# Patient Record
Sex: Female | Born: 1975 | Race: White | Hispanic: No | Smoking: Current every day smoker
Health system: Southern US, Community
[De-identification: ages and names within clinical notes are randomized; demographics above are authoritative.]

## PROBLEM LIST (undated history)

## (undated) DIAGNOSIS — F329 Major depressive disorder, single episode, unspecified: Secondary | ICD-10-CM

## (undated) DIAGNOSIS — F419 Anxiety disorder, unspecified: Secondary | ICD-10-CM

## (undated) DIAGNOSIS — I10 Essential (primary) hypertension: Secondary | ICD-10-CM

## (undated) DIAGNOSIS — F32A Depression, unspecified: Secondary | ICD-10-CM

## (undated) DIAGNOSIS — E7800 Pure hypercholesterolemia, unspecified: Secondary | ICD-10-CM

## (undated) HISTORY — PX: TONSILLECTOMY: SUR1361

---

## 2008-01-05 ENCOUNTER — Emergency Department: Payer: Self-pay | Admitting: Unknown Physician Specialty

## 2010-11-15 ENCOUNTER — Emergency Department: Payer: Self-pay | Admitting: Emergency Medicine

## 2010-12-21 ENCOUNTER — Emergency Department: Payer: Self-pay | Admitting: Emergency Medicine

## 2011-02-07 ENCOUNTER — Emergency Department: Payer: Self-pay | Admitting: Emergency Medicine

## 2011-05-10 ENCOUNTER — Inpatient Hospital Stay: Payer: Self-pay | Admitting: Psychiatry

## 2011-05-21 ENCOUNTER — Emergency Department: Payer: Self-pay | Admitting: Internal Medicine

## 2011-05-29 ENCOUNTER — Emergency Department: Payer: Self-pay | Admitting: Emergency Medicine

## 2011-06-22 ENCOUNTER — Ambulatory Visit: Payer: Self-pay | Admitting: Physician Assistant

## 2011-11-24 ENCOUNTER — Emergency Department: Payer: Self-pay | Admitting: Emergency Medicine

## 2011-12-20 ENCOUNTER — Inpatient Hospital Stay: Payer: Self-pay | Admitting: Psychiatry

## 2011-12-20 LAB — URINALYSIS, COMPLETE
Leukocyte Esterase: NEGATIVE
Nitrite: NEGATIVE
Ph: 5 (ref 4.5–8.0)
Protein: 30
RBC,UR: 23 /HPF (ref 0–5)
Specific Gravity: 1.025 (ref 1.003–1.030)

## 2011-12-20 LAB — COMPREHENSIVE METABOLIC PANEL
Albumin: 3.8 g/dL (ref 3.4–5.0)
Alkaline Phosphatase: 91 U/L (ref 50–136)
Bilirubin,Total: 0.2 mg/dL (ref 0.2–1.0)
Calcium, Total: 9.1 mg/dL (ref 8.5–10.1)
Chloride: 104 mmol/L (ref 98–107)
Co2: 24 mmol/L (ref 21–32)
Creatinine: 0.68 mg/dL (ref 0.60–1.30)
EGFR (Non-African Amer.): >60
Potassium: 3.5 mmol/L (ref 3.5–5.1)
SGOT(AST): 23 U/L (ref 15–37)
SGPT (ALT): 28 U/L
Total Protein: 7.5 g/dL (ref 6.4–8.2)

## 2011-12-20 LAB — DRUG SCREEN, URINE
Barbiturates, Ur Screen: NEGATIVE (ref ?–200)
Benzodiazepine, Ur Scrn: NEGATIVE (ref ?–200)
Cannabinoid 50 Ng, Ur ~~LOC~~: NEGATIVE (ref ?–50)
Cocaine Metabolite,Ur ~~LOC~~: NEGATIVE (ref ?–300)
MDMA (Ecstasy)Ur Screen: NEGATIVE (ref ?–500)
Opiate, Ur Screen: NEGATIVE (ref ?–300)
Tricyclic, Ur Screen: NEGATIVE (ref ?–1000)

## 2011-12-20 LAB — SALICYLATE LEVEL: Salicylates, Serum: 4.2 mg/dL — ABNORMAL HIGH

## 2011-12-20 LAB — CBC
HCT: 39.7 % (ref 35.0–47.0)
MCV: 85 fL (ref 80–100)
RBC: 4.69 10*6/uL (ref 3.80–5.20)
RDW: 13.6 % (ref 11.5–14.5)
WBC: 12.4 10*3/uL — ABNORMAL HIGH (ref 3.6–11.0)

## 2011-12-20 LAB — PREGNANCY, URINE: Pregnancy Test, Urine: NEGATIVE m[IU]/mL

## 2011-12-20 LAB — ACETAMINOPHEN LEVEL: Acetaminophen: <2 ug/mL

## 2011-12-27 LAB — FOLATE: Folic Acid: 6.6 ng/mL (ref 3.1–100.0)

## 2011-12-27 LAB — LIPID PANEL
Cholesterol: 222 mg/dL — ABNORMAL HIGH (ref 0–200)
Ldl Cholesterol, Calc: 112 mg/dL — ABNORMAL HIGH (ref 0–100)
Triglycerides: 383 mg/dL — ABNORMAL HIGH (ref 0–200)
VLDL Cholesterol, Calc: 77 mg/dL — ABNORMAL HIGH (ref 5–40)

## 2012-01-01 ENCOUNTER — Emergency Department: Payer: Self-pay | Admitting: Emergency Medicine

## 2012-08-21 LAB — URINALYSIS, COMPLETE
Bacteria: NONE SEEN
Ketone: NEGATIVE
Protein: NEGATIVE
RBC,UR: 7 /HPF (ref 0–5)
Squamous Epithelial: 10

## 2012-08-21 LAB — DRUG SCREEN, URINE
Amphetamines, Ur Screen: NEGATIVE (ref ?–1000)
Barbiturates, Ur Screen: NEGATIVE (ref ?–200)
Cocaine Metabolite,Ur ~~LOC~~: NEGATIVE (ref ?–300)
MDMA (Ecstasy)Ur Screen: NEGATIVE (ref ?–500)
Methadone, Ur Screen: NEGATIVE (ref ?–300)
Opiate, Ur Screen: NEGATIVE (ref ?–300)
Phencyclidine (PCP) Ur S: NEGATIVE (ref ?–25)
Tricyclic, Ur Screen: NEGATIVE (ref ?–1000)

## 2012-08-21 LAB — CBC
HCT: 33.4 % — ABNORMAL LOW (ref 35.0–47.0)
MCH: 27.5 pg (ref 26.0–34.0)
MCHC: 32.6 g/dL (ref 32.0–36.0)
Platelet: 582 10*3/uL — ABNORMAL HIGH (ref 150–440)
RDW: 15.6 % — ABNORMAL HIGH (ref 11.5–14.5)
WBC: 11.1 10*3/uL — ABNORMAL HIGH (ref 3.6–11.0)

## 2012-08-21 LAB — COMPREHENSIVE METABOLIC PANEL
Calcium, Total: 8.5 mg/dL (ref 8.5–10.1)
Chloride: 109 mmol/L — ABNORMAL HIGH (ref 98–107)
Co2: 22 mmol/L (ref 21–32)
Creatinine: 0.47 mg/dL — ABNORMAL LOW (ref 0.60–1.30)
EGFR (African American): >60
Glucose: 86 mg/dL (ref 65–99)
Potassium: 3.3 mmol/L — ABNORMAL LOW (ref 3.5–5.1)
SGPT (ALT): 16 U/L (ref 12–78)
Sodium: 139 mmol/L (ref 136–145)

## 2012-08-21 LAB — ETHANOL: Ethanol %: <0.003 % (ref 0.000–0.080)

## 2012-08-21 LAB — PREGNANCY, URINE: Pregnancy Test, Urine: NEGATIVE m[IU]/mL

## 2012-08-21 LAB — SALICYLATE LEVEL: Salicylates, Serum: 21.4 mg/dL — ABNORMAL HIGH

## 2012-08-22 ENCOUNTER — Inpatient Hospital Stay: Payer: Self-pay | Admitting: Psychiatry

## 2012-08-23 LAB — BEHAVIORAL MEDICINE 1 PANEL
Albumin: 3.1 g/dL — ABNORMAL LOW (ref 3.4–5.0)
Alkaline Phosphatase: 71 U/L (ref 50–136)
Anion Gap: 6 — ABNORMAL LOW (ref 7–16)
BUN: 14 mg/dL (ref 7–18)
Basophil #: 0.2 10*3/uL — ABNORMAL HIGH (ref 0.0–0.1)
Basophil %: 2.3 %
Bilirubin,Total: 0.1 mg/dL — ABNORMAL LOW (ref 0.2–1.0)
Calcium, Total: 8.3 mg/dL — ABNORMAL LOW (ref 8.5–10.1)
Chloride: 109 mmol/L — ABNORMAL HIGH (ref 98–107)
Co2: 26 mmol/L (ref 21–32)
Creatinine: 0.52 mg/dL — ABNORMAL LOW (ref 0.60–1.30)
EGFR (African American): >60
EGFR (Non-African Amer.): >60
Eosinophil #: 0.4 10*3/uL (ref 0.0–0.7)
Eosinophil %: 4.7 %
Glucose: 94 mg/dL (ref 65–99)
HCT: 30 % — ABNORMAL LOW (ref 35.0–47.0)
HGB: 9.7 g/dL — ABNORMAL LOW (ref 12.0–16.0)
Lymphocyte #: 4.2 10*3/uL — ABNORMAL HIGH (ref 1.0–3.6)
Lymphocyte %: 46.7 %
MCH: 27 pg (ref 26.0–34.0)
MCHC: 32.2 g/dL (ref 32.0–36.0)
MCV: 84 fL (ref 80–100)
Monocyte #: 0.4 x10 3/mm (ref 0.2–0.9)
Monocyte %: 4.9 %
Neutrophil #: 3.8 10*3/uL (ref 1.4–6.5)
Neutrophil %: 41.4 %
Osmolality: 281 (ref 275–301)
Platelet: 551 10*3/uL — ABNORMAL HIGH (ref 150–440)
Potassium: 3.4 mmol/L — ABNORMAL LOW (ref 3.5–5.1)
RBC: 3.59 10*6/uL — ABNORMAL LOW (ref 3.80–5.20)
RDW: 15.3 % — ABNORMAL HIGH (ref 11.5–14.5)
SGOT(AST): 13 U/L — ABNORMAL LOW (ref 15–37)
SGPT (ALT): 12 U/L (ref 12–78)
Sodium: 141 mmol/L (ref 136–145)
Thyroid Stimulating Horm: 0.221 u[IU]/mL — ABNORMAL LOW
Total Protein: 6.3 g/dL — ABNORMAL LOW (ref 6.4–8.2)
WBC: 9.1 10*3/uL (ref 3.6–11.0)

## 2012-08-23 LAB — URINALYSIS, COMPLETE
Bilirubin,UR: NEGATIVE
Glucose,UR: NEGATIVE mg/dL (ref 0–75)
Ketone: NEGATIVE
Leukocyte Esterase: NEGATIVE
Nitrite: NEGATIVE
Ph: 6 (ref 4.5–8.0)
Protein: NEGATIVE
RBC,UR: 2 /HPF (ref 0–5)
Specific Gravity: 1.014 (ref 1.003–1.030)
Squamous Epithelial: 5
WBC UR: 4 /HPF (ref 0–5)

## 2012-10-22 ENCOUNTER — Emergency Department: Payer: Self-pay | Admitting: Emergency Medicine

## 2012-10-22 LAB — COMPREHENSIVE METABOLIC PANEL
Albumin: 3.4 g/dL (ref 3.4–5.0)
Alkaline Phosphatase: 76 U/L (ref 50–136)
Anion Gap: 6 — ABNORMAL LOW (ref 7–16)
BUN: 11 mg/dL (ref 7–18)
Calcium, Total: 8.4 mg/dL — ABNORMAL LOW (ref 8.5–10.1)
Chloride: 109 mmol/L — ABNORMAL HIGH (ref 98–107)
EGFR (African American): >60
EGFR (Non-African Amer.): >60
Glucose: 109 mg/dL — ABNORMAL HIGH (ref 65–99)
Osmolality: 279 (ref 275–301)
Potassium: 3.3 mmol/L — ABNORMAL LOW (ref 3.5–5.1)
SGPT (ALT): 12 U/L (ref 12–78)
Sodium: 140 mmol/L (ref 136–145)

## 2012-10-22 LAB — CBC
HGB: 10.8 g/dL — ABNORMAL LOW (ref 12.0–16.0)
MCH: 26.8 pg (ref 26.0–34.0)
MCHC: 33 g/dL (ref 32.0–36.0)
Platelet: 470 10*3/uL — ABNORMAL HIGH (ref 150–440)
RBC: 4.02 10*6/uL (ref 3.80–5.20)

## 2012-10-22 LAB — CK TOTAL AND CKMB (NOT AT ARMC)
CK, Total: 51 U/L (ref 21–215)
CK-MB: <0.5 ng/mL — ABNORMAL LOW (ref 0.5–3.6)
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
## 2014-10-02 NOTE — Discharge Summary (Signed)
PATIENT NAME:  Megan Mejia, Megan Mejia MR#:  [id number] DATE OF BIRTH:  September 05, 1975  DATE OF ADMISSION:  08/22/2012 DATE OF DISCHARGE:  08/26/2012  HOSPITAL COURSE: See dictated history and physical for details of admission. This 39 year old woman with a history of depression and anxiety was brought to the hospital after overdosing on soma. She had had a shock when she discovered that 41-year-old son would not be coming back to live with her. She had recently been having some increased depression and anxiety. She was initially somewhat evasive about taking the overdose, but then admitted that she had done it. She denied however, that she was having any suicidal intent, continuing to insist that she just wanted to get some rest. In the hospital, the patient did not engage in any dangerous or suicidal behavior. Her affect was initially dysphoric and anxious, but improved and became more appropriately reactive during her hospital stay. The patient was treated with venlafaxine, which was increased to 225 mg a day. Also continued on low dose alprazolam, trazodone at night as well as her medical medications. By the time of discharge, the patient was very clearly able to site important things in her life that she was looking forward to. Denied suicidal ideation. Felt more upbeat and optimistic. Did not display any psychotic symptoms. She was planning to stay back home and agreeable to [facility].   DISCHARGE MEDICATIONS: Venlafaxine 225 mg extended-release per day, ibuprofen 800 mg q. 8 hours as needed for pain, alprazolam twice a day 1 mg, trazodone 200 mg at night, Zocor 20 mg at night, Neurontin 300 mg 3 times a day, lisinopril 20 mg per day.   LABORATORY RESULTS: Admission labs showed a drug screen borderline probably not infected. Chemistry panel had multiple abnormalities to the creatinine low at 0.52, potassium low at 3.4, calcium low at 8.3, bilirubin low at 0.1, AST low  at 13, total protein low at 6.3, albumin low at 3.1. TSH low at 0.221. CBC also showed a hematocrit low at 30 and a hemoglobin low at 9.7. Salicylates were elevated on admission at 21.4. Pregnancy test negative. Drug screen negative.   MENTAL STATUS EXAM AT DISCHARGE: Casually dressed, neatly groomed woman, looks her stated age. Good eye contact. Normal psychomotor activity. Speech normal in rate, tone and volume. Affect is euthymic, reactive and appropriate. Denies suicidal or homicidal ideation. Shows lucid thinking. Denies any hallucinations. Intelligence average. Short and long-term memory grossly intact. Judgment and insight improved.   DISPOSITION: Discharge home, follow-up with [facility].   DIAGNOSIS, PRINCIPAL AND PRIMARY:   AXIS I: Major depressive disorder, recurrent, severe.   SECONDARY DIAGNOSES: AXIS I: Deferred.   AXIS II: Deferred with borderline traits.   AXIS III: Hypertension, dyslipidemia, chronic pain.   AXIS IV: Mental illness, recent loss with death in the family, financial, housing and employment.   AXIS V: Functioning at time of discharge 48.     ____________________________ Audery AmelJohn T. Mattison Stuckey, MD jtc:cc D: 09/10/2012 18:07:00 ET T: 09/10/2012 23:49:08 ET JOB#: [id number]  cc: Audery AmelJohn T. Tahj Njoku, MD, <Dictator> Audery AmelJOHN T Osualdo Hansell MD ELECTRONICALLY SIGNED 09/11/2012 11:00
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
## 2014-10-02 NOTE — Discharge Summary (Signed)
PATIENT NAME:  Megan Mejia, Megan Mejia MR#:  [id number] DATE OF BIRTH:  11/19/1975  DATE OF ADMISSION:  08/22/2012 DATE OF DISCHARGE:  08/26/2012  HOSPITAL COURSE: See dictated history and physical for details of admission. This 39 year old woman with a history of depression and anxiety was brought to the hospital after overdosing on soma. She had had a shock when she discovered that 41-year-old son would not be coming back to live with her. She had recently been having some increased depression and anxiety. She was initially somewhat evasive about taking the overdose, but then admitted that she had done it. She denied however, that she was having any suicidal intent, continuing to insist that she just wanted to get some rest. In the hospital, the patient did not engage in any dangerous or suicidal behavior. Her affect was initially dysphoric and anxious, but improved and became more appropriately reactive during her hospital stay. The patient was treated with venlafaxine, which was increased to 225 mg a day. Also continued on low dose alprazolam, trazodone at night as well as her medical medications. By the time of discharge, the patient was very clearly able to site important things in her life that she was looking forward to. Denied suicidal ideation. Felt more upbeat and optimistic. Did not display any psychotic symptoms. She was planning to stay back home and agreeable to [facility].   DISCHARGE MEDICATIONS: Venlafaxine 225 mg extended-release per day, ibuprofen 800 mg q. 8 hours as needed for pain, alprazolam twice a day 1 mg, trazodone 200 mg at night, Zocor 20 mg at night, Neurontin 300 mg 3 times a day, lisinopril 20 mg per day.   LABORATORY RESULTS: Admission labs showed a drug screen borderline probably not infected. Chemistry panel had multiple abnormalities to the creatinine low at 0.52, potassium low at 3.4, calcium low at 8.3, bilirubin low at 0.1, AST low  at 13, total protein low at 6.3, albumin low at 3.1. TSH low at 0.221. CBC also showed a hematocrit low at 30 and a hemoglobin low at 9.7. Salicylates were elevated on admission at 21.4. Pregnancy test negative. Drug screen negative.   MENTAL STATUS EXAM AT DISCHARGE: Casually dressed, neatly groomed woman, looks her stated age. Good eye contact. Normal psychomotor activity. Speech normal in rate, tone and volume. Affect is euthymic, reactive and appropriate. Denies suicidal or homicidal ideation. Shows lucid thinking. Denies any hallucinations. Intelligence average. Short and long-term memory grossly intact. Judgment and insight improved.   DISPOSITION: Discharge home, follow-up with [facility].   DIAGNOSIS, PRINCIPAL AND PRIMARY:   AXIS I: Major depressive disorder, recurrent, severe.   SECONDARY DIAGNOSES: AXIS I: Deferred.   AXIS II: Deferred with borderline traits.   AXIS III: Hypertension, dyslipidemia, chronic pain.   AXIS IV: Mental illness, recent loss with death in the family, financial, housing and employment.   AXIS V: Functioning at time of discharge 48.     ____________________________ Audery AmelJohn T. Dulce Martian, MD jtc:cc D: 09/10/2012 18:07:44 ET T: 09/10/2012 23:49:08 ET JOB#: [id number]  cc: Audery AmelJohn T. Alyiah Ulloa, MD, <Dictator>
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
## 2017-05-27 ENCOUNTER — Other Ambulatory Visit: Payer: Self-pay

## 2017-05-27 ENCOUNTER — Encounter: Payer: Self-pay | Admitting: Emergency Medicine

## 2017-05-27 ENCOUNTER — Emergency Department
Admission: EM | Admit: 2017-05-27 | Discharge: 2017-05-29 | Disposition: A | Discharge status: Other Acute Inpatient Hospital | Payer: Self-pay | Attending: Emergency Medicine | Admitting: Emergency Medicine

## 2017-05-27 DIAGNOSIS — F419 Anxiety disorder, unspecified: Secondary | ICD-10-CM | POA: Insufficient documentation

## 2017-05-27 DIAGNOSIS — E876 Hypokalemia: Secondary | ICD-10-CM | POA: Insufficient documentation

## 2017-05-27 DIAGNOSIS — Z046 Encounter for general psychiatric examination, requested by authority: Secondary | ICD-10-CM | POA: Insufficient documentation

## 2017-05-27 DIAGNOSIS — R454 Irritability and anger: Secondary | ICD-10-CM | POA: Insufficient documentation

## 2017-05-27 DIAGNOSIS — Z9114 Patient's other noncompliance with medication regimen: Secondary | ICD-10-CM | POA: Insufficient documentation

## 2017-05-27 DIAGNOSIS — F23 Brief psychotic disorder: Secondary | ICD-10-CM

## 2017-05-27 DIAGNOSIS — R441 Visual hallucinations: Secondary | ICD-10-CM | POA: Insufficient documentation

## 2017-05-27 DIAGNOSIS — I10 Essential (primary) hypertension: Secondary | ICD-10-CM | POA: Insufficient documentation

## 2017-05-27 DIAGNOSIS — R440 Auditory hallucinations: Secondary | ICD-10-CM | POA: Insufficient documentation

## 2017-05-27 DIAGNOSIS — F331 Major depressive disorder, recurrent, moderate: Secondary | ICD-10-CM | POA: Insufficient documentation

## 2017-05-27 DIAGNOSIS — F17210 Nicotine dependence, cigarettes, uncomplicated: Secondary | ICD-10-CM | POA: Insufficient documentation

## 2017-05-27 HISTORY — DX: Depression, unspecified: F32.A

## 2017-05-27 HISTORY — DX: Anxiety disorder, unspecified: F41.9

## 2017-05-27 HISTORY — DX: Pure hypercholesterolemia, unspecified: E78.00

## 2017-05-27 HISTORY — DX: Major depressive disorder, single episode, unspecified: F32.9

## 2017-05-27 HISTORY — DX: Essential (primary) hypertension: I10

## 2017-05-27 LAB — COMPREHENSIVE METABOLIC PANEL
ALK PHOS: 115 U/L (ref 38–126)
ALT: 11 U/L — ABNORMAL LOW (ref 14–54)
ANION GAP: 10 (ref 5–15)
AST: 21 U/L (ref 15–41)
Albumin: 3.5 g/dL (ref 3.5–5.0)
BILIRUBIN TOTAL: 0.2 mg/dL — AB (ref 0.3–1.2)
BUN: 9 mg/dL (ref 6–20)
CALCIUM: 8.8 mg/dL — AB (ref 8.9–10.3)
CO2: 20 mmol/L — ABNORMAL LOW (ref 22–32)
CREATININE: 0.59 mg/dL (ref 0.44–1.00)
Chloride: 109 mmol/L (ref 101–111)
Glucose, Bld: 125 mg/dL — ABNORMAL HIGH (ref 65–99)
Potassium: 2.8 mmol/L — ABNORMAL LOW (ref 3.5–5.1)
SODIUM: 139 mmol/L (ref 135–145)
TOTAL PROTEIN: 6.7 g/dL (ref 6.5–8.1)

## 2017-05-27 LAB — CBC
HCT: 33.2 % — ABNORMAL LOW (ref 35.0–47.0)
Hemoglobin: 10.5 g/dL — ABNORMAL LOW (ref 12.0–16.0)
MCH: 23.6 pg — ABNORMAL LOW (ref 26.0–34.0)
MCHC: 31.5 g/dL — ABNORMAL LOW (ref 32.0–36.0)
MCV: 74.9 fL — ABNORMAL LOW (ref 80.0–100.0)
PLATELETS: 561 10*3/uL — AB (ref 150–440)
RBC: 4.44 MIL/uL (ref 3.80–5.20)
RDW: 16.7 % — AB (ref 11.5–14.5)
WBC: 10 10*3/uL (ref 3.6–11.0)

## 2017-05-27 LAB — URINE DRUG SCREEN, QUALITATIVE (ARMC ONLY)
AMPHETAMINES, UR SCREEN: NOT DETECTED
BENZODIAZEPINE, UR SCRN: NOT DETECTED
Barbiturates, Ur Screen: NOT DETECTED
Cannabinoid 50 Ng, Ur ~~LOC~~: POSITIVE — AB
Cocaine Metabolite,Ur ~~LOC~~: NOT DETECTED
MDMA (ECSTASY) UR SCREEN: NOT DETECTED
Methadone Scn, Ur: NOT DETECTED
Opiate, Ur Screen: NOT DETECTED
Phencyclidine (PCP) Ur S: NOT DETECTED
TRICYCLIC, UR SCREEN: NOT DETECTED

## 2017-05-27 LAB — ETHANOL

## 2017-05-27 NOTE — ED Notes (Signed)
[facility] Camera placed in room and process explained to pt who verbalizes understanding.

## 2017-05-27 NOTE — ED Provider Notes (Signed)
[facility] Emergency Department Provider Note  ____________________________________________   First MD Initiated Contact with Patient 05/27/17 2250     (approximate)  I have reviewed the triage vital signs and the nursing notes.   HISTORY  Chief Complaint Psychiatric Evaluation   HPI Megan Mejia is a 41 y.o. female who self presents to the emergency department requesting a refill of her Effexor.  Apparently she called her niece today asking to bring her to the hospital because she was worried about herself.  She says that she carries a past medical history of depression and has not seen her psychiatrist recently.  She has reported recently hearing voices that other people have not been able to hear.  Her symptoms have had gradual onset and have been constant.  Nothing seems to make it better or worse.  She denies ingestion.  She denies suicidal ideation.  Past Medical History:  Diagnosis Date  . Anxiety   . Depression   . High cholesterol   . Hypertension     There are no active problems to display for this patient.   Past Surgical History:  Procedure Laterality Date  . TONSILLECTOMY      Prior to Admission medications   Not on File    Allergies Patient has no known allergies.  History reviewed. No pertinent family history.  Social History Social History   Tobacco Use  . Smoking status: Current Every Day Smoker    Packs/day: 1.00    Types: Cigarettes  . Smokeless tobacco: Never Used  Substance Use Topics  . Alcohol use: Yes    Comment: every day  . Drug use: Yes    Types: Marijuana    Review of Systems Constitutional: No fever/chills Eyes: No visual changes. ENT: No sore throat. Cardiovascular: Denies chest pain. Respiratory: Denies shortness of breath. Gastrointestinal: No abdominal pain.  No nausea, no vomiting.  No diarrhea.  No constipation. Genitourinary: Negative for dysuria. Musculoskeletal: Negative for back  pain. Skin: Negative for rash. Neurological: Negative for headaches, focal weakness or numbness.   ____________________________________________   PHYSICAL EXAM:  VITAL SIGNS: ED Triage Vitals [05/27/17 2229]  Enc Vitals Group     BP 131/89     Pulse Rate 94     Resp 16     Temp 98.5 F (36.9 C)     Temp Source Oral     SpO2 99 %     Weight 158 lb (71.7 kg)     Height 5' (1.524 m)     Head Circumference      Peak Flow      Pain Score      Pain Loc      Pain Edu?      Excl. in GC?     Constitutional: Alert and oriented x4 strange affect no diaphoresis appears to be responding to internal stimuli Eyes: PERRL EOMI. Head: Atraumatic. Nose: No congestion/rhinnorhea. Mouth/Throat: No trismus Neck: No stridor.   Cardiovascular: Normal rate, regular rhythm. Grossly normal heart sounds.  Good peripheral circulation. Respiratory: Normal respiratory effort.  No retractions. Lungs CTAB and moving good air Gastrointestinal: Soft nontender Musculoskeletal: No lower extremity edema   Neurologic:  Normal speech and language. No gross focal neurologic deficits are appreciated. Skin:  Skin is warm, dry and intact. No rash noted. Psychiatric: Strange affect and responding to internal stimuli    ____________________________________________   DIFFERENTIAL includes but not limited to  Major depressive disorder, psychosis, schizophrenia, medication noncompliance, ingestion ____________________________________________  LABS (all labs ordered are listed, but only abnormal results are displayed)  Labs Reviewed  COMPREHENSIVE METABOLIC PANEL - Abnormal; Notable for the following components:      Result Value   Potassium 2.8 (*)    CO2 20 (*)    Glucose, Bld 125 (*)    Calcium 8.8 (*)    ALT 11 (*)    Total Bilirubin 0.2 (*)    All other components within normal limits  CBC - Abnormal; Notable for the following components:   Hemoglobin 10.5 (*)    HCT 33.2 (*)    MCV 74.9 (*)     MCH 23.6 (*)    MCHC 31.5 (*)    RDW 16.7 (*)    Platelets 561 (*)    All other components within normal limits  URINE DRUG SCREEN, QUALITATIVE ([facility] ONLY) - Abnormal; Notable for the following components:   Cannabinoid 50 Ng, Ur [place] POSITIVE (*)    All other components within normal limits  ETHANOL  PREGNANCY, URINE    Blood work reviewed by me slight hypokalemia and positive for cannabis otherwise unremarkable __________________________________________  EKG   ____________________________________________  RADIOLOGY   ____________________________________________   PROCEDURES  Procedure(s) performed: no  Procedures  Critical Care performed: no  Observation: no ____________________________________________   INITIAL IMPRESSION / ASSESSMENT AND PLAN / ED COURSE  Pertinent labs & imaging results that were available during my care of the patient were reviewed by me and considered in my medical decision making (see chart for details).  The patient has an unremarkable exam.  She does not require involuntary commitment right now and she does want to speak to a telemetry psychiatry tonight.    ----------------------------------------- 12:48 AM on 05/28/2017 -----------------------------------------  Patient is medically stable for psychiatric evaluation.  The psychiatrist Dr. Karleen Dolphinodriguez Galvis who recommends inpatient psychiatric admission.    ____________________________________________   FINAL CLINICAL IMPRESSION(S) / ED DIAGNOSES  Final diagnoses:  Moderate episode of recurrent major depressive disorder (HCC)  Hypokalemia      NEW MEDICATIONS STARTED DURING THIS VISIT:  This SmartLink is deprecated. Use AVSMEDLIST instead to display the medication list for a patient.   Note:  This document was prepared using Dragon voice recognition software and may include unintentional dictation errors.     Merrily Brittleifenbark, Nicle Connole, MD 05/28/17 [phone]

## 2017-05-27 NOTE — BH Assessment (Signed)
Assessment Note  Megan Mejia is an 41 y.o. female. Ms. Megan Mejia arrived to the ED by way of personal transportation by her niece.  She reports that she was in the house all day watching TV and felt that she was trapped in her house.  She states that mind games have been played on her all day.  She states that there is stuff happening in the world and that she is uncomfortable going outside of her home. She states that she feels things going on around her that make her feel uneasy. She reports that she is "very anxious".  She reports that she feels that people are talking in code around her.  She feels as if she is waiting for something to happen.    She reports worrying excessively if her family is safe.  She denied having suicidal or homicidal ideation or intent.  She reports drinking "a little bit of alcohol and a little bit of marijuana".  She denied having symptoms of depression. She reports that she feels that there is something she needs to remember, but cannot. She reports that she is feeling like she is being attacked by demons and they are waiting outside to get her.  She reports that she lost her job recently.  Diagnosis: Auditory Hallucinations, Anxiety  Past Medical History:  Past Medical History:  Diagnosis Date  . Anxiety   . Depression   . High cholesterol   . Hypertension     Past Surgical History:  Procedure Laterality Date  . TONSILLECTOMY      Family History: History reviewed. No pertinent family history.  Social History:  reports that she has been smoking cigarettes.  She has been smoking about 1.00 pack per day. she has never used smokeless tobacco. She reports that she drinks alcohol. She reports that she uses drugs. Drug: Marijuana.  Additional Social History:  Alcohol / Drug Use History of alcohol / drug use?: Yes Substance #1 Name of Substance 1: Alcohol 1 - Age of First Use: 17 1 - Amount (size/oz): couple beers 1 - Frequency: daily 1 - Last Use /  Amount: 05/27/2017 Substance #2 Name of Substance 2: Marijuana 2 - Age of First Use: 17 2 - Amount (size/oz): "a little bit" 2 - Frequency: daily 2 - Last Use / Amount: 05/27/2017  CIWA: CIWA-Ar BP: 131/89 Pulse Rate: 94 COWS:    Allergies: No Known Allergies  Home Medications:  (Not in a hospital admission)  OB/GYN Status:  Patient's last menstrual period was 05/21/2017 (approximate).  General Assessment Data [facility] Assessment: In system Is this a Tele or Face-to-Face Assessment?: Face-to-Face Is this an Initial Assessment or a Re-assessment for this encounter?: Initial Assessment Marital status: Married MahaffeyMaiden name: Megan Mejia Is patient pregnant?: No Pregnancy Status: No Living Arrangements: Alone Can pt return to current living arrangement?: Yes Admission Status: Voluntary Is patient capable of signing voluntary admission?: Yes Referral Source: Self/Family/Friend Insurance type: None  Medical Screening Exam [facility] Walk-in ONLY) Medical Exam completed: Yes  Crisis Care Plan Living Arrangements: Alone Legal Guardian: Other:(Self) Name of Psychiatrist: [facility] Name of Therapist: None  Education Status Is patient currently in school?: No Current Grade: n/a Highest grade of school patient has completed: 8th Name of school: [employer] Contact person: n/a  Risk to self with the past 6 months Suicidal Ideation: No Has patient been a risk to self within the past 6 months prior to admission? : No Suicidal Intent: No Has patient  had any suicidal intent within the past 6 months prior to admission? : No Is patient at risk for suicide?: No Suicidal Plan?: No Has patient had any suicidal plan within the past 6 months prior to admission? : No Access to Means: No What has been your use of drugs/alcohol within the last 12 months?: daily use of beer and marijuana Previous Attempts/Gestures: Yes How many times?: 2(age 67, and 19) Other Self  Harm Risks: denied Triggers for Past Attempts: Unknown Intentional Self Injurious Behavior: None Family Suicide History: No Recent stressful life event(s): Other (Comment)(Reports stress, but could not articulate what is stressing h) Persecutory voices/beliefs?: No Depression: No Depression Symptoms: (denied) Substance abuse history and/or treatment for substance abuse?: No Suicide prevention information given to non-admitted patients: Not applicable  Risk to Others within the past 6 months Homicidal Ideation: No Does patient have any lifetime risk of violence toward others beyond the six months prior to admission? : No Thoughts of Harm to Others: No Current Homicidal Intent: No Current Homicidal Plan: No Access to Homicidal Means: No Identified Victim: None identified History of harm to others?: No Assessment of Violence: None Noted Violent Behavior Description: denied Does patient have access to weapons?: No Criminal Charges Pending?: No Does patient have a court date: No Is patient on probation?: Yes(credit card fraud)  Psychosis Hallucinations: Auditory Delusions: (Paranoid)  Mental Status Report Appearance/Hygiene: In scrubs Eye Contact: Fair Motor Activity: Unremarkable Speech: Pressured Level of Consciousness: Alert Mood: Anxious Affect: Anxious Anxiety Level: Moderate Thought Processes: Relevant Judgement: Partial Orientation: Place, Time, Situation Obsessive Compulsive Thoughts/Behaviors: None  Cognitive Functioning Memory: Unable to Assess IQ: Average Insight: Poor Impulse Control: Fair Appetite: Good Sleep: Decreased Vegetative Symptoms: Staying in bed  ADLScreening [facility] Assessment Services) Patient's cognitive ability adequate to safely complete daily activities?: Yes Patient able to express need for assistance with ADLs?: Yes Independently performs ADLs?: Yes (appropriate for developmental age)  Prior Inpatient Therapy Prior Inpatient Therapy:  Yes Prior Therapy Dates: 1998 Prior Therapy Facilty/Provider(s): [facility] Reason for Treatment: SI  Prior Outpatient Therapy Prior Outpatient Therapy: Yes Prior Therapy Dates: Currently Prior Therapy Facilty/Provider(s): [facility] Reason for Treatment: Anxiety, depression Does patient have an ACCT team?: No Does patient have Intensive In-House Services?  : No Does patient have [facility] services? : No Does patient have P4CC services?: No  ADL Screening (condition at time of admission) Patient's cognitive ability adequate to safely complete daily activities?: Yes Is the patient deaf or have difficulty hearing?: No Does the patient have difficulty seeing, even when wearing glasses/contacts?: No Does the patient have difficulty concentrating, remembering, or making decisions?: No Patient able to express need for assistance with ADLs?: Yes Does the patient have difficulty dressing or bathing?: No Independently performs ADLs?: Yes (appropriate for developmental age) Does the patient have difficulty walking or climbing stairs?: No Weakness of Legs: None Weakness of Arms/Hands: None  Home Assistive Devices/Equipment Home Assistive Devices/Equipment: None          Advance Directives (For Healthcare) Does Patient Have a Medical Advance Directive?: No Would patient like information on creating a medical advance directive?: No - Patient declined    Additional Information 1:1 In Past 12 Months?: No CIRT Risk: No Elopement Risk: No Does patient have medical clearance?: Yes     Disposition:  Disposition Initial Assessment Completed for this Encounter: Yes Disposition of Patient: Pending Review with psychiatrist  On Site Evaluation by:   Reviewed with Physician:    Justice DeedsKeisha Deicy Rusk 05/27/2017 11:47 PM

## 2017-05-27 NOTE — ED Triage Notes (Signed)
Pt presents to ED for psych eval. Pt states she takes effexor ran out 1 wk ago. States she feels like the people on tv and her phone are trying to send her messages, feels like she's speaking in code and that the people around are playing mind games with her. Denies SI/HI.

## 2017-05-28 DIAGNOSIS — F331 Major depressive disorder, recurrent, moderate: Secondary | ICD-10-CM

## 2017-05-28 DIAGNOSIS — F23 Brief psychotic disorder: Secondary | ICD-10-CM

## 2017-05-28 LAB — PREGNANCY, URINE: Preg Test, Ur: NEGATIVE

## 2017-05-28 MED ORDER — HYDROXYZINE HCL 25 MG PO TABS
25.0000 mg | ORAL_TABLET | Freq: Two times a day (BID) | ORAL | Status: DC
  Administered 2017-05-28 – 2017-05-29 (×4): 25 mg via ORAL
  Filled 2017-05-28 (×4): qty 1

## 2017-05-28 MED ORDER — NICOTINE 21 MG/24HR TD PT24
21.0000 mg | MEDICATED_PATCH | Freq: Every day | TRANSDERMAL | Status: DC
  Administered 2017-05-29: 21 mg via TRANSDERMAL
  Filled 2017-05-28 (×2): qty 1

## 2017-05-28 MED ORDER — RISPERIDONE 0.5 MG PO TABS
0.5000 mg | ORAL_TABLET | Freq: Two times a day (BID) | ORAL | Status: DC
  Administered 2017-05-28 – 2017-05-29 (×2): 0.5 mg via ORAL
  Filled 2017-05-28 (×4): qty 1

## 2017-05-28 MED ORDER — ALPRAZOLAM 0.5 MG PO TABS
0.2500 mg | ORAL_TABLET | Freq: Two times a day (BID) | ORAL | Status: DC
  Administered 2017-05-28: 0.25 mg via ORAL
  Administered 2017-05-29: 11:00:00 via ORAL
  Filled 2017-05-28 (×2): qty 1

## 2017-05-28 MED ORDER — POTASSIUM CHLORIDE CRYS ER 20 MEQ PO TBCR
40.0000 meq | EXTENDED_RELEASE_TABLET | Freq: Once | ORAL | Status: AC
  Administered 2017-05-28: 40 meq via ORAL
  Filled 2017-05-28: qty 2

## 2017-05-28 NOTE — ED Notes (Signed)
Report was received from Dorise HissElizabeth C., RN; Pt. Verbalizes  complaints of having auditory and visual hallucinations; and distress of having been off her meds x 1 week;; denies S.I./Hi. Continue to monitor with 15 min. Monitoring.

## 2017-05-28 NOTE — ED Notes (Signed)

## 2017-05-28 NOTE — ED Notes (Signed)
Patient c/o feeling anxious; received Atarax 25 mgs p.o.

## 2017-05-28 NOTE — ED Notes (Signed)
Report called to Margaret, RN in ED [facility] 

## 2017-05-28 NOTE — ED Notes (Signed)
BEHAVIORAL HEALTH ROUNDING  Patient sleeping: No.  Patient alert and oriented: yes  Behavior appropriate: Yes. ; If no, describe:  Nutrition and fluids offered: Yes  Toileting and hygiene offered: Yes  Sitter present: not applicable, Q 15 min safety rounds and observation.  Law enforcement present: Yes ODS  

## 2017-05-28 NOTE — ED Notes (Signed)

## 2017-05-28 NOTE — ED Notes (Signed)
Patient alert and verbal. Patient approached x 3 by this writer to see if she would engage in conversation but patient was guarded and stated she just wants to leave and get to her kids. Patient stated she did not want to talk. Patient left alone and Q 15 minute checks in progress and patient remains safe on unit.

## 2017-05-28 NOTE — ED Notes (Addendum)
Pt. Alert, warm and dry, in no distress. Pt. Denies SI, HI, and AVH. Pt became upset and threw remote to TV. Back and batteries come off and out of remote. All pieces of remote collected by this Clinical research associatewriter. Pt. Encouraged to let nursing staff know of any concerns or needs.

## 2017-05-28 NOTE — Consult Note (Signed)
[facility] Consult   Reason for Consult: Consult for 41 year old woman with a history of depression and anxiety brought in by family because of confusion Referring Physician: Paduchowski Patient Identification: Megan Mejia MRN:  [id number] Principal Diagnosis: Acute psychosis [facility]) Diagnosis:   Patient Active Problem List   Diagnosis Date Noted  . Acute psychosis ([place]) [F23] 05/28/2017  . Depression, major, recurrent, moderate ([place]) [F33.1] 05/28/2017    Total Time spent with patient: 1 hour  Subjective:   Megan Mejia is a 41 y.o. female patient admitted with "I do not remember".  HPI: Patient interviewed chart reviewed.  This 41 year old woman was brought to the emergency room last night because of acute confusion.  We have very little information right now to go on.  On interview today the patient was initially disoriented.  When I asked her if she knew where she was she at first told me that we were on a ship.  She later corrected herself to the hospital.  She also told me that she thought we were in [place] although she was able to correct herself later on that.  Patient tells me that she has been feeling confused at home.  She also tells me that the television and her cell phone have been talking to her sending her messages making her feel confused.  She denies suicidal or homicidal thoughts.  She has a very difficult time explaining anything about her recent history.  She does tell me that she drinks several beers a day but cannot remember how long it has been since she last had any.  Denies that she is using any other drugs.  She says she is not currently on any psychiatric medicine and she cannot remember when she was last taking any.  Social history: Evidently lives by herself but has family the check in on her.  Medical history: Apparently no diagnosed ongoing medical problems.  Substance abuse history: Patient has a past history of substance abuse  problems alcohol having been prominent in the past also having been on chronic benzodiazepines in the past.  Patient's where she has not been taking any pills or drinking acutely although she was drinking until sometime in the recent past.  Past Psychiatric History: Patient has a past history of depression and anxiety.  Previous hospitalizations were years ago.  At that time she was taking Effexor.  Psychosis was not part of the problem at that time.  Patient denies ever trying to kill herself denies any history of violence.  Risk to Self: Suicidal Ideation: No Suicidal Intent: No Is patient at risk for suicide?: No Suicidal Plan?: No Access to Means: No What has been your use of drugs/alcohol within the last 12 months?: daily use of beer and marijuana How many times?: 2(age 32, and 19) Other Self Harm Risks: denied Triggers for Past Attempts: Unknown Intentional Self Injurious Behavior: None Risk to Others: Homicidal Ideation: No Thoughts of Harm to Others: No Current Homicidal Intent: No Current Homicidal Plan: No Access to Homicidal Means: No Identified Victim: None identified History of harm to others?: No Assessment of Violence: None Noted Violent Behavior Description: denied Does patient have access to weapons?: No Criminal Charges Pending?: No Does patient have a court date: No Prior Inpatient Therapy: Prior Inpatient Therapy: Yes Prior Therapy Dates: 1998 Prior Therapy Facilty/Provider(s): [facility] Reason for Treatment: SI Prior Outpatient Therapy: Prior Outpatient Therapy: Yes Prior Therapy Dates: Currently Prior Therapy Facilty/Provider(s): [facility] Reason for Treatment: Anxiety, depression Does patient  have an ACCT team?: No Does patient have Intensive In-House Services?  : No Does patient have [facility] services? : No Does patient have P4[facility] services?: No  Past Medical History:  Past Medical History:  Diagnosis Date  . Anxiety   . Depression   . High cholesterol    . Hypertension     Past Surgical History:  Procedure Laterality Date  . TONSILLECTOMY     Family History: History reviewed. No pertinent family history. Family Psychiatric  History: She says her father had schizophrenia Social History:  Social History   Substance and Sexual Activity  Alcohol Use Yes   Comment: every day     Social History   Substance and Sexual Activity  Drug Use Yes  . Types: Marijuana    Social History   Socioeconomic History  . Marital status: Divorced    Spouse name: None  . Number of children: None  . Years of education: None  . Highest education level: None  Social Needs  . Financial resource strain: None  . Food insecurity - worry: None  . Food insecurity - inability: None  . Transportation needs - medical: None  . Transportation needs - non-medical: None  Occupational History  . None  Tobacco Use  . Smoking status: Current Every Day Smoker    Packs/day: 1.00    Types: Cigarettes  . Smokeless tobacco: Never Used  Substance and Sexual Activity  . Alcohol use: Yes    Comment: every day  . Drug use: Yes    Types: Marijuana  . Sexual activity: None  Other Topics Concern  . None  Social History Narrative  . None   Additional Social History:    Allergies:  No Known Allergies  Labs:  Results for orders placed or performed during the hospital encounter of 05/27/17 (from the past 48 hour(s))  Comprehensive metabolic panel     Status: Abnormal   Collection Time: 05/27/17 10:30 PM  Result Value Ref Range   Sodium 139 135 - 145 mmol/L   Potassium 2.8 (L) 3.5 - 5.1 mmol/L   Chloride 109 101 - 111 mmol/L   CO2 20 (L) 22 - 32 mmol/L   Glucose, Bld 125 (H) 65 - 99 mg/dL   BUN 9 6 - 20 mg/dL   Creatinine, Ser 0.59 0.44 - 1.00 mg/dL   Calcium 8.8 (L) 8.9 - 10.3 mg/dL   Total Protein 6.7 6.5 - 8.1 g/dL   Albumin 3.5 3.5 - 5.0 g/dL   AST 21 15 - 41 U/L   ALT 11 (L) 14 - 54 U/L   Alkaline Phosphatase 115 38 - 126 U/L   Total Bilirubin  0.2 (L) 0.3 - 1.2 mg/dL   GFR calc non Af Amer >60 >60 mL/min   GFR calc Af Amer >60 >60 mL/min    Comment: (NOTE) The eGFR has been calculated using the CKD EPI equation. This calculation has not been validated in all clinical situations. eGFR's persistently <60 mL/min signify possible Chronic Kidney Disease.    Anion gap 10 5 - 15  Ethanol     Status: None   Collection Time: 05/27/17 10:30 PM  Result Value Ref Range   Alcohol, Ethyl (B) <10 <10 mg/dL    Comment:        LOWEST DETECTABLE LIMIT FOR SERUM ALCOHOL IS 10 mg/dL FOR MEDICAL PURPOSES ONLY   cbc     Status: Abnormal   Collection Time: 05/27/17 10:30 PM  Result Value Ref Range  WBC 10.0 3.6 - 11.0 K/uL   RBC 4.44 3.80 - 5.20 MIL/uL   Hemoglobin 10.5 (L) 12.0 - 16.0 g/dL   HCT 33.2 (L) 35.0 - 47.0 %   MCV 74.9 (L) 80.0 - 100.0 fL   MCH 23.6 (L) 26.0 - 34.0 pg   MCHC 31.5 (L) 32.0 - 36.0 g/dL   RDW 16.7 (H) 11.5 - 14.5 %   Platelets 561 (H) 150 - 440 K/uL  Urine Drug Screen, Qualitative     Status: Abnormal   Collection Time: 05/27/17 10:30 PM  Result Value Ref Range   Tricyclic, Ur Screen NONE DETECTED NONE DETECTED   Amphetamines, Ur Screen NONE DETECTED NONE DETECTED   MDMA (Ecstasy)Ur Screen NONE DETECTED NONE DETECTED   Cocaine Metabolite,Ur Brice NONE DETECTED NONE DETECTED   Opiate, Ur Screen NONE DETECTED NONE DETECTED   Phencyclidine (PCP) Ur S NONE DETECTED NONE DETECTED   Cannabinoid 50 Ng, Ur Ruso POSITIVE (A) NONE DETECTED   Barbiturates, Ur Screen NONE DETECTED NONE DETECTED   Benzodiazepine, Ur Scrn NONE DETECTED NONE DETECTED   Methadone Scn, Ur NONE DETECTED NONE DETECTED    Comment: (NOTE) 364  Tricyclics, urine               Cutoff 1000 ng/mL 200  Amphetamines, urine             Cutoff 1000 ng/mL 300  MDMA (Ecstasy), urine           Cutoff 500 ng/mL 400  Cocaine Metabolite, urine       Cutoff 300 ng/mL 500  Opiate, urine                   Cutoff 300 ng/mL 600  Phencyclidine (PCP), urine       Cutoff 25 ng/mL 700  Cannabinoid, urine              Cutoff 50 ng/mL 800  Barbiturates, urine             Cutoff 200 ng/mL 900  Benzodiazepine, urine           Cutoff 200 ng/mL 1000 Methadone, urine                Cutoff 300 ng/mL 1100 1200 The urine drug screen provides only a preliminary, unconfirmed 1300 analytical test result and should not be used for non-medical 1400 purposes. Clinical consideration and professional judgment should 1500 be applied to any positive drug screen result due to possible 1600 interfering substances. A more specific alternate chemical method 1700 must be used in order to obtain a confirmed analytical result.  1800 Gas chromato graphy / mass spectrometry (GC/MS) is the preferred 1900 confirmatory method.   Pregnancy, urine     Status: None   Collection Time: 05/27/17 10:30 PM  Result Value Ref Range   Preg Test, Ur NEGATIVE NEGATIVE    Current Facility-Administered Medications  Medication Dose Route Frequency Provider Last Rate Last Dose  . hydrOXYzine (ATARAX/VISTARIL) tablet 25 mg  25 mg Oral BID Darel Hong, MD   25 mg at 05/28/17 1100  . nicotine (NICODERM CQ - dosed in mg/24 hours) patch 21 mg  21 mg Transdermal Daily Salih Williamson, Madie Reno, MD       No current outpatient medications on file.    Musculoskeletal: Strength & Muscle Tone: within normal limits Gait & Station: normal Patient leans: N/A  Psychiatric Specialty Exam: Physical Exam  Nursing note and vitals reviewed. Constitutional: She appears well-developed and  well-nourished.  HENT:  Head: Normocephalic and atraumatic.  Eyes: Conjunctivae are normal. Pupils are equal, round, and reactive to light.  Neck: Normal range of motion.  Cardiovascular: Regular rhythm and normal heart sounds.  Respiratory: Effort normal.  GI: Soft.  Musculoskeletal: Normal range of motion.  Neurological: She is alert.  Skin: Skin is warm and dry.  Psychiatric: Her affect is blunt. Her speech is  delayed and tangential. She is slowed. She is not agitated and not aggressive. Thought content is paranoid and delusional. Cognition and memory are impaired. She expresses inappropriate judgment. She expresses no homicidal and no suicidal ideation.    Review of Systems  Constitutional: Negative.   HENT: Negative.   Eyes: Negative.   Respiratory: Negative.   Cardiovascular: Negative.   Gastrointestinal: Negative.   Musculoskeletal: Negative.   Skin: Negative.   Neurological: Negative.   Psychiatric/Behavioral: Positive for hallucinations and memory loss. Negative for depression, substance abuse and suicidal ideas. The patient is nervous/anxious and has insomnia.     Blood pressure 123/89, pulse 85, temperature 98.4 F (36.9 C), temperature source Oral, resp. rate 16, height 5' (1.524 m), weight 71.7 kg (158 lb), last menstrual period 05/21/2017, SpO2 100 %.Body mass index is 30.86 kg/m.  General Appearance: Disheveled  Eye Contact:  Fair  Speech:  Slow  Volume:  Decreased  Mood:  Anxious and Depressed  Affect:  Constricted  Thought Process:  Disorganized  Orientation:  Negative  Thought Content:  Illogical, Delusions and Hallucinations: Auditory  Suicidal Thoughts:  No  Homicidal Thoughts:  No  Memory:  Immediate;   Poor Recent;   Poor Remote;   Fair  Judgement:  Impaired  Insight:  Lacking  Psychomotor Activity:  Decreased  Concentration:  Concentration: Poor  Recall:  Poor  Fund of Knowledge:  Poor  Language:  Fair  Akathisia:  No  Handed:  Right  AIMS (if indicated):     Assets:  Desire for Improvement Resilience  ADL's:  Impaired  Cognition:  Impaired,  Mild  Sleep:        Treatment Plan Summary: Daily contact with patient to assess and evaluate symptoms and progress in treatment, Medication management and Plan 41 year old woman who presents with acute confusion.  A little bit hard to be certain what this is.  I am calling it acute psychosis but there is an  element about it that is somewhat like delirium as well.  She is describing having had ideas of reference and hallucinations for several days but her descriptions of it are confused as well.  Differential diagnosis includes acute psychosis which could either be from a psychotic depression or from some medication versus a delirium which could be from an illness or from withdrawal from alcohol or Xanax.  She does not appear to be grossly tremulous however and does not look very much like somebody and severe alcohol withdrawal.  Patient is clearly too confused to be discharged.  I am going to plan to admit her to the psychiatric hospital but start her on not only a low-dose of antipsychotic but a low dose of Xanax to see if that replaces something  from which she might be in withdrawal.  Patient understands the plan Case reviewed with emergency room physician.  Disposition: Recommend psychiatric Inpatient admission when medically cleared. Supportive therapy provided about ongoing stressors.  Alethia Berthold, MD 05/28/2017 4:36 PM

## 2017-05-29 ENCOUNTER — Other Ambulatory Visit: Payer: Self-pay

## 2017-05-29 ENCOUNTER — Inpatient Hospital Stay
Admission: AD | Admit: 2017-05-29 | Discharge: 2017-06-08 | DRG: 885 | Disposition: A | Discharge status: Home / Self Care | Payer: No Typology Code available for payment source | Attending: Psychiatry | Admitting: Psychiatry

## 2017-05-29 DIAGNOSIS — Z9119 Patient's noncompliance with other medical treatment and regimen: Secondary | ICD-10-CM | POA: Diagnosis not present

## 2017-05-29 DIAGNOSIS — F1290 Cannabis use, unspecified, uncomplicated: Secondary | ICD-10-CM | POA: Diagnosis present

## 2017-05-29 DIAGNOSIS — Z79899 Other long term (current) drug therapy: Secondary | ICD-10-CM

## 2017-05-29 DIAGNOSIS — Z915 Personal history of self-harm: Secondary | ICD-10-CM

## 2017-05-29 DIAGNOSIS — F23 Brief psychotic disorder: Secondary | ICD-10-CM | POA: Diagnosis present

## 2017-05-29 DIAGNOSIS — F17210 Nicotine dependence, cigarettes, uncomplicated: Secondary | ICD-10-CM | POA: Diagnosis present

## 2017-05-29 DIAGNOSIS — E876 Hypokalemia: Secondary | ICD-10-CM | POA: Diagnosis present

## 2017-05-29 DIAGNOSIS — Z560 Unemployment, unspecified: Secondary | ICD-10-CM

## 2017-05-29 DIAGNOSIS — F411 Generalized anxiety disorder: Secondary | ICD-10-CM | POA: Diagnosis present

## 2017-05-29 DIAGNOSIS — F333 Major depressive disorder, recurrent, severe with psychotic symptoms: Secondary | ICD-10-CM | POA: Diagnosis not present

## 2017-05-29 DIAGNOSIS — G43909 Migraine, unspecified, not intractable, without status migrainosus: Secondary | ICD-10-CM | POA: Diagnosis present

## 2017-05-29 DIAGNOSIS — F1210 Cannabis abuse, uncomplicated: Secondary | ICD-10-CM | POA: Diagnosis not present

## 2017-05-29 MED ORDER — RISPERIDONE 0.5 MG PO TABS
0.5000 mg | ORAL_TABLET | Freq: Two times a day (BID) | ORAL | Status: DC
  Administered 2017-05-29 – 2017-06-01 (×6): 0.5 mg via ORAL
  Filled 2017-05-29 (×6): qty 1

## 2017-05-29 MED ORDER — NICOTINE 21 MG/24HR TD PT24
21.0000 mg | MEDICATED_PATCH | Freq: Every day | TRANSDERMAL | Status: DC
  Administered 2017-05-30 – 2017-06-08 (×10): 21 mg via TRANSDERMAL
  Filled 2017-05-29 (×10): qty 1

## 2017-05-29 MED ORDER — TRAZODONE HCL 100 MG PO TABS
100.0000 mg | ORAL_TABLET | Freq: Every evening | ORAL | Status: DC | PRN
  Administered 2017-05-29: 100 mg via ORAL
  Filled 2017-05-29: qty 1

## 2017-05-29 MED ORDER — MAGNESIUM HYDROXIDE 400 MG/5ML PO SUSP: 30.0000 mL | Freq: Every day | ORAL | Status: DC | PRN

## 2017-05-29 MED ORDER — HYDROXYZINE HCL 25 MG PO TABS
25.0000 mg | ORAL_TABLET | Freq: Three times a day (TID) | ORAL | Status: DC | PRN
  Administered 2017-05-30 – 2017-06-08 (×15): 25 mg via ORAL
  Filled 2017-05-29 (×15): qty 1

## 2017-05-29 MED ORDER — HYDROXYZINE HCL 25 MG PO TABS
25.0000 mg | ORAL_TABLET | Freq: Two times a day (BID) | ORAL | Status: DC
  Administered 2017-05-29 – 2017-06-01 (×6): 25 mg via ORAL
  Filled 2017-05-29 (×6): qty 1

## 2017-05-29 MED ORDER — ACETAMINOPHEN 325 MG PO TABS
650.0000 mg | ORAL_TABLET | Freq: Four times a day (QID) | ORAL | Status: DC | PRN
  Administered 2017-05-31 – 2017-06-04 (×5): 650 mg via ORAL
  Filled 2017-05-29 (×7): qty 2

## 2017-05-29 MED ORDER — ALUM & MAG HYDROXIDE-SIMETH 200-200-20 MG/5ML PO SUSP: 30.0000 mL | ORAL | Status: DC | PRN

## 2017-05-29 MED ORDER — POTASSIUM CHLORIDE CRYS ER 20 MEQ PO TBCR
40.0000 meq | EXTENDED_RELEASE_TABLET | Freq: Once | ORAL | Status: AC
  Administered 2017-05-29: 40 meq via ORAL
  Filled 2017-05-29: qty 2

## 2017-05-29 NOTE — Plan of Care (Signed)
  Health Behavior/Discharge Planning: Compliance with prescribed medication regimen will improve 05/29/2017 2342 - Progressing by Delos HaringPhillips, Easter Kennebrew A, RN [facility]   Safety: Ability to redirect hostility and anger into socially appropriate behaviors will improve 05/29/2017 2342 - Progressing by Delos HaringPhillips, Laresha Bacorn A, RN Note Pt safe on the unit at this time

## 2017-05-29 NOTE — ED Notes (Signed)
ivc/pending placement.. 

## 2017-05-29 NOTE — BH Assessment (Addendum)
[facility] Dr. Toni Amendlapacs.  Attending Physician will be Dr. Johnella MoloneyMcNew.   Patient [facility].   Intake Paper Work has been signed and placed on patient chart.  ER staff is aware of the admission Drinda Butts( [facility].; Dr. Manson PasseyBrown, ER MD; Godfrey PickKimberly S. Patient's Nurse & Josh Patient Access).

## 2017-05-29 NOTE — Plan of Care (Signed)
New admission

## 2017-05-29 NOTE — Progress Notes (Addendum)
D: Pt denies SI/HI/AVH. Pt is pleasant and cooperative. Pt very suspicious and appears bizarre at times. Pt appears to be responding to internal stimuli at times. Pt forwards little information at this time, but pt will talk when engaged, but pt is very cautious and guarded .   A: Pt was offered support and encouragement. Pt was given scheduled medications. Pt was encourage to attend groups. Q 15 minute checks were done for safety.   R: safety maintained on unit.

## 2017-05-29 NOTE — Tx Team (Signed)
Initial Treatment Plan 05/29/2017 6:42 PM Megan MessingKimberly K Dack ZOX:[id number]    PATIENT STRESSORS: Financial difficulties Medication change or noncompliance   PATIENT STRENGTHS: Ability for insight Active sense of humor Capable of independent living Communication skills Supportive family/friends   PATIENT IDENTIFIED PROBLEMS: Depression 05/29/2017  Non-compliant with medication 05/29/2017  Auditory Hallucinations 05/29/2017                 DISCHARGE CRITERIA:  Ability to meet basic life and health needs Improved stabilization in mood, thinking, and/or behavior  PRELIMINARY DISCHARGE PLAN: Outpatient therapy Return to previous living arrangement  PATIENT/FAMILY INVOLVEMENT: This treatment plan has been presented to and reviewed with the patient, Megan Mejia, and/or family member.  The patient and family have been given the opportunity to ask questions and make suggestions.  Galen ManilaAlexis E Shelanda Duvall, RN 05/29/2017, 6:42 PM

## 2017-05-29 NOTE — Consult Note (Signed)
[name] Consult   Reason for Consult: Follow-up consult 41 year old woman who has presented to the emergency room with confusion.  Seen today the patient remains confused.  For example, during our conversation she asked me very seriously if I thought that she was still alive.  She admits that she is feeling paranoid and hearing things.  Vital signs are stable blood pressure is all stable.  Patient is requesting to be back on trazodone and also lisinopril although her blood pressure is normal. Referring Physician: Siadecki Patient Identification: Megan Mejia MRN:  [id number] Principal Diagnosis: Acute psychosis [facility]) Diagnosis:   Patient Active Problem List   Diagnosis Date Noted  . Acute psychosis ([place]) [F23] 05/28/2017  . Depression, major, recurrent, moderate ([place]) [F33.1] 05/28/2017    Total Time spent with patient: 30 minutes  Subjective:   Megan Mejia is a 41 y.o. female patient admitted with see note above.  Patient continues to voice delusional and paranoid thoughts to evidence confusion poor self-care poor concentration.  Vital signs stable no obvious medical issue.  HPI: See note above.  No longer appears to clearly be in any kind of withdrawal  Past Psychiatric History: History of anxiety depression  Risk to Self: Suicidal Ideation: No Suicidal Intent: No Is patient at risk for suicide?: No Suicidal Plan?: No Access to Means: No What has been your use of drugs/alcohol within the last 12 months?: daily use of beer and marijuana How many times?: 2(age 70, and 19) Other Self Harm Risks: denied Triggers for Past Attempts: Unknown Intentional Self Injurious Behavior: None Risk to Others: Homicidal Ideation: No Thoughts of Harm to Others: No Current Homicidal Intent: No Current Homicidal Plan: No Access to Homicidal Means: No Identified Victim: None identified History of harm to others?: No Assessment of Violence: None Noted Violent  Behavior Description: denied Does patient have access to weapons?: No Criminal Charges Pending?: No Does patient have a court date: No Prior Inpatient Therapy: Prior Inpatient Therapy: Yes Prior Therapy Dates: 1998 Prior Therapy Facilty/Provider(s): [facility] Reason for Treatment: SI Prior Outpatient Therapy: Prior Outpatient Therapy: Yes Prior Therapy Dates: Currently Prior Therapy Facilty/Provider(s): [facility] Reason for Treatment: Anxiety, depression Does patient have an ACCT team?: No Does patient have Intensive In-House Services?  : No Does patient have [facility] services? : No Does patient have P4CC services?: No  Past Medical History:  Past Medical History:  Diagnosis Date  . Anxiety   . Depression   . High cholesterol   . Hypertension     Past Surgical History:  Procedure Laterality Date  . TONSILLECTOMY     Family History: History reviewed. No pertinent family history. Family Psychiatric  History: None identified Social History:  Social History   Substance and Sexual Activity  Alcohol Use Yes   Comment: every day     Social History   Substance and Sexual Activity  Drug Use Yes  . Types: Marijuana    Social History   Socioeconomic History  . Marital status: Divorced    Spouse name: None  . Number of children: None  . Years of education: None  . Highest education level: None  Social Needs  . Financial resource strain: None  . Food insecurity - worry: None  . Food insecurity - inability: None  . Transportation needs - medical: None  . Transportation needs - non-medical: None  Occupational History  . None  Tobacco Use  . Smoking status: Current Every Day Smoker    Packs/day: 1.00  Types: Cigarettes  . Smokeless tobacco: Never Used  Substance and Sexual Activity  . Alcohol use: Yes    Comment: every day  . Drug use: Yes    Types: Marijuana  . Sexual activity: None  Other Topics Concern  . None  Social History Narrative  . None    Additional Social History:    Allergies:  No Known Allergies  Labs:  Results for orders placed or performed during the hospital encounter of 05/27/17 (from the past 48 hour(s))  Comprehensive metabolic panel     Status: Abnormal   Collection Time: 05/27/17 10:30 PM  Result Value Ref Range   Sodium 139 135 - 145 mmol/L   Potassium 2.8 (L) 3.5 - 5.1 mmol/L   Chloride 109 101 - 111 mmol/L   CO2 20 (L) 22 - 32 mmol/L   Glucose, Bld 125 (H) 65 - 99 mg/dL   BUN 9 6 - 20 mg/dL   Creatinine, Ser 0.59 0.44 - 1.00 mg/dL   Calcium 8.8 (L) 8.9 - 10.3 mg/dL   Total Protein 6.7 6.5 - 8.1 g/dL   Albumin 3.5 3.5 - 5.0 g/dL   AST 21 15 - 41 U/L   ALT 11 (L) 14 - 54 U/L   Alkaline Phosphatase 115 38 - 126 U/L   Total Bilirubin 0.2 (L) 0.3 - 1.2 mg/dL   GFR calc non Af Amer >60 >60 mL/min   GFR calc Af Amer >60 >60 mL/min    Comment: (NOTE) The eGFR has been calculated using the CKD EPI equation. This calculation has not been validated in all clinical situations. eGFR's persistently <60 mL/min signify possible Chronic Kidney Disease.    Anion gap 10 5 - 15  Ethanol     Status: None   Collection Time: 05/27/17 10:30 PM  Result Value Ref Range   Alcohol, Ethyl (B) <10 <10 mg/dL    Comment:        LOWEST DETECTABLE LIMIT FOR SERUM ALCOHOL IS 10 mg/dL FOR MEDICAL PURPOSES ONLY   cbc     Status: Abnormal   Collection Time: 05/27/17 10:30 PM  Result Value Ref Range   WBC 10.0 3.6 - 11.0 K/uL   RBC 4.44 3.80 - 5.20 MIL/uL   Hemoglobin 10.5 (L) 12.0 - 16.0 g/dL   HCT 33.2 (L) 35.0 - 47.0 %   MCV 74.9 (L) 80.0 - 100.0 fL   MCH 23.6 (L) 26.0 - 34.0 pg   MCHC 31.5 (L) 32.0 - 36.0 g/dL   RDW 16.7 (H) 11.5 - 14.5 %   Platelets 561 (H) 150 - 440 K/uL  Urine Drug Screen, Qualitative     Status: Abnormal   Collection Time: 05/27/17 10:30 PM  Result Value Ref Range   Tricyclic, Ur Screen NONE DETECTED NONE DETECTED   Amphetamines, Ur Screen NONE DETECTED NONE DETECTED   MDMA (Ecstasy)Ur  Screen NONE DETECTED NONE DETECTED   Cocaine Metabolite,Ur Horatio NONE DETECTED NONE DETECTED   Opiate, Ur Screen NONE DETECTED NONE DETECTED   Phencyclidine (PCP) Ur S NONE DETECTED NONE DETECTED   Cannabinoid 50 Ng, Ur  POSITIVE (A) NONE DETECTED   Barbiturates, Ur Screen NONE DETECTED NONE DETECTED   Benzodiazepine, Ur Scrn NONE DETECTED NONE DETECTED   Methadone Scn, Ur NONE DETECTED NONE DETECTED    Comment: (NOTE) 539  Tricyclics, urine               Cutoff 1000 ng/mL 200  Amphetamines, urine  Cutoff 1000 ng/mL 300  MDMA (Ecstasy), urine           Cutoff 500 ng/mL 400  Cocaine Metabolite, urine       Cutoff 300 ng/mL 500  Opiate, urine                   Cutoff 300 ng/mL 600  Phencyclidine (PCP), urine      Cutoff 25 ng/mL 700  Cannabinoid, urine              Cutoff 50 ng/mL 800  Barbiturates, urine             Cutoff 200 ng/mL 900  Benzodiazepine, urine           Cutoff 200 ng/mL 1000 Methadone, urine                Cutoff 300 ng/mL 1100 1200 The urine drug screen provides only a preliminary, unconfirmed 1300 analytical test result and should not be used for non-medical 1400 purposes. Clinical consideration and professional judgment should 1500 be applied to any positive drug screen result due to possible 1600 interfering substances. A more specific alternate chemical method 1700 must be used in order to obtain a confirmed analytical result.  1800 Gas chromato graphy / mass spectrometry (GC/MS) is the preferred 1900 confirmatory method.   Pregnancy, urine     Status: None   Collection Time: 05/27/17 10:30 PM  Result Value Ref Range   Preg Test, Ur NEGATIVE NEGATIVE    Current Facility-Administered Medications  Medication Dose Route Frequency Provider Last Rate Last Dose  . hydrOXYzine (ATARAX/VISTARIL) tablet 25 mg  25 mg Oral BID Darel Hong, MD   25 mg at 05/29/17 1046  . nicotine (NICODERM CQ - dosed in mg/24 hours) patch 21 mg  21 mg Transdermal Daily  [name], Madie Reno, MD   21 mg at 05/29/17 1048  . risperiDONE (RISPERDAL) tablet 0.5 mg  0.5 mg Oral BID [name], Madie Reno, MD   0.5 mg at 05/29/17 1046   Current Outpatient Medications  Medication Sig Dispense Refill  . HydrOXYzine HCl (ATARAX PO) Take by mouth.    . Topiramate (TOPAMAX PO) Take by mouth.    . Venlafaxine HCl (EFFEXOR XR PO) Take by mouth.      Musculoskeletal: Strength & Muscle Tone: within normal limits Gait & Station: normal Patient leans: N/A  Psychiatric Specialty Exam: Physical Exam  Nursing note and vitals reviewed. Constitutional: She appears well-developed and well-nourished.  HENT:  Head: Normocephalic and atraumatic.  Eyes: Conjunctivae are normal. Pupils are equal, round, and reactive to light.  Neck: Normal range of motion.  Cardiovascular: Regular rhythm and normal heart sounds.  Respiratory: Effort normal. No respiratory distress.  GI: Soft.  Musculoskeletal: Normal range of motion.  Neurological: She is alert.  Skin: Skin is warm and dry.  Psychiatric: Her mood appears anxious. Her affect is blunt. Her speech is delayed and tangential. She is slowed. She is not agitated and not aggressive. Thought content is paranoid and delusional. Cognition and memory are impaired.    Review of Systems  Constitutional: Negative.   HENT: Negative.   Eyes: Negative.   Respiratory: Negative.   Cardiovascular: Negative.   Gastrointestinal: Negative.   Musculoskeletal: Negative.   Skin: Negative.   Neurological: Negative.   Psychiatric/Behavioral: Positive for hallucinations and memory loss. Negative for depression, substance abuse and suicidal ideas. The patient is nervous/anxious. The patient does not have insomnia.     Blood pressure  107/79, pulse 75, temperature 98.4 F (36.9 C), temperature source Oral, resp. rate 16, height 5' (1.524 m), weight 71.7 kg (158 lb), last menstrual period 05/21/2017, SpO2 100 %.Body mass index is 30.86 kg/m.  General  Appearance: Disheveled  Eye Contact:  Fair  Speech:  Slow  Volume:  Decreased  Mood:  Dysphoric  Affect:  Constricted  Thought Process:  Goal Directed  Orientation:  Negative  Thought Content:  Illogical and Ideas of Reference:   Paranoia Delusions  Suicidal Thoughts:  No  Homicidal Thoughts:  No  Memory:  Immediate;   Fair Recent;   Poor Remote;   Poor  Judgement:  Impaired  Insight:  Lacking  Psychomotor Activity:  Decreased  Concentration:  Concentration: Fair  Recall:  Poor  Fund of Knowledge:  Fair  Language:  Fair  Akathisia:  No  Handed:  Right  AIMS (if indicated):     Assets:  Desire for Improvement Physical Health  ADL's:  Impaired  Cognition:  Impaired,  Mild  Sleep:        Treatment Plan Summary: Daily contact with patient to assess and evaluate symptoms and progress in treatment, Medication management and Plan 41 year old woman who presents with psychotic symptoms and continues to be confused with psychotic symptoms today.  Does not appear to be delirious.  Able to stay focused pretty much during the examination but not thinking clearly and having ideas of reference.  Vital signs all stable.  I am going to discontinue the little bit of Xanax she was getting and continue the Risperdal.  Plans are for admission today with orders in place.  Disposition: Recommend psychiatric Inpatient admission when medically cleared.  Alethia Berthold, MD 05/29/2017 12:24 PM

## 2017-05-30 DIAGNOSIS — F411 Generalized anxiety disorder: Secondary | ICD-10-CM | POA: Diagnosis present

## 2017-05-30 LAB — LIPID PANEL
CHOL/HDL RATIO: 6.9 ratio
CHOLESTEROL: 227 mg/dL — AB (ref 0–200)
HDL: 33 mg/dL — AB (ref >40)
LDL Cholesterol: 143 mg/dL — ABNORMAL HIGH (ref 0–99)
TRIGLYCERIDES: 256 mg/dL — AB (ref <150)
VLDL: 51 mg/dL — ABNORMAL HIGH (ref 0–40)

## 2017-05-30 LAB — BASIC METABOLIC PANEL
ANION GAP: 7 (ref 5–15)
BUN: 14 mg/dL (ref 6–20)
CHLORIDE: 109 mmol/L (ref 101–111)
CO2: 23 mmol/L (ref 22–32)
Calcium: 8.9 mg/dL (ref 8.9–10.3)
Creatinine, Ser: 0.64 mg/dL (ref 0.44–1.00)
GFR calc non Af Amer: >60 mL/min (ref >60)
Glucose, Bld: 100 mg/dL — ABNORMAL HIGH (ref 65–99)
POTASSIUM: 3.5 mmol/L (ref 3.5–5.1)
SODIUM: 139 mmol/L (ref 135–145)

## 2017-05-30 LAB — URINALYSIS, COMPLETE (UACMP) WITH MICROSCOPIC
BACTERIA UA: NONE SEEN
BILIRUBIN URINE: NEGATIVE
Glucose, UA: NEGATIVE mg/dL
Hgb urine dipstick: NEGATIVE
KETONES UR: NEGATIVE mg/dL
LEUKOCYTES UA: NEGATIVE
Nitrite: NEGATIVE
PH: 5 (ref 5.0–8.0)
PROTEIN: NEGATIVE mg/dL
RBC / HPF: NONE SEEN RBC/hpf (ref 0–5)
Specific Gravity, Urine: 1.014 (ref 1.005–1.030)

## 2017-05-30 LAB — TSH: TSH: 0.812 u[IU]/mL (ref 0.350–4.500)

## 2017-05-30 LAB — HEMOGLOBIN A1C
HEMOGLOBIN A1C: 5.6 % (ref 4.8–5.6)
MEAN PLASMA GLUCOSE: 114.02 mg/dL

## 2017-05-30 MED ORDER — VENLAFAXINE HCL 37.5 MG PO TABS
37.5000 mg | ORAL_TABLET | Freq: Two times a day (BID) | ORAL | Status: DC
  Administered 2017-05-30 – 2017-06-01 (×4): 37.5 mg via ORAL
  Filled 2017-05-30 (×5): qty 1

## 2017-05-30 MED ORDER — VENLAFAXINE HCL 37.5 MG PO TABS
37.5000 mg | ORAL_TABLET | Freq: Two times a day (BID) | ORAL | Status: DC
  Filled 2017-05-30: qty 1

## 2017-05-30 MED ORDER — VENLAFAXINE HCL ER 75 MG PO CP24: 75.0000 mg | ORAL_CAPSULE | Freq: Every day | ORAL | Status: DC

## 2017-05-30 MED ORDER — TOPIRAMATE 25 MG PO TABS
50.0000 mg | ORAL_TABLET | Freq: Two times a day (BID) | ORAL | Status: DC
  Administered 2017-05-30 – 2017-06-01 (×4): 50 mg via ORAL
  Filled 2017-05-30 (×4): qty 2

## 2017-05-30 NOTE — Plan of Care (Addendum)
  Health Behavior/Discharge Planning: Compliance with prescribed medication regimen will improve 05/30/2017 1439 - Progressing by Elige Radonobb, Vashon Arch B, RN 05/30/2017 1433 - Progressing by Elige Radonobb, Tadeo Besecker B, RN  Patient shows difficulty verbalizes feelings at times.  Appetite poor.  Mild confusion.  Support and encouragement offered.  Educated on medications.  Safety rounds maintained.

## 2017-05-30 NOTE — Progress Notes (Signed)
Recreation Therapy Notes  Date: 12.19.2018  Time: 1:00pm   Location: [facility]  Behavioral response: N/A  Intervention Topic: Self-esteem  Discussion/Intervention: Patient did not attend group. Clinical Observations/Feedback:  Patient did not attend group.   Marjo Grosvenor LRT/CTRS          Jaye Polidori 05/30/2017 2:52 PM 

## 2017-05-30 NOTE — [facility] Group Notes (Signed)
[facility] Group Notes:  (Nursing/MHT/Case Management/Adjunct)  Date:  05/30/2017  Time:  10:30 PM  Type of Therapy:  Group Therapy  Participation Level:  Active  Participation Quality:  Came in late.  Affect:  Appropriate  Cognitive:  Alert  Insight:  Good  Engagement in Group:  Supportive  Modes of Intervention:  Activity  Summary of Progress/Problems:  Mayra NeerJackie L Shirleen Mcfaul 05/30/2017, 10:30 PM

## 2017-05-30 NOTE — Progress Notes (Signed)
Admission Note:   41 yr old female who presents IVC in no acute distress for the treatment of auditory hallucinations and wanting to get back on her Effexor. Report was given to this Clinical research associatewriter by Lincoln Maxinlivette, RN at 5:40 pm. Patient appears flat and depressed. Patient was calm and cooperative with admission process. Patient contracts for safety upon admission. Patient states that "there are things going on in the world" and also states that she brought herself in because she wanted help, patient states "I know I told them that my niece brought me in, but I am Megan Mejia". Patient also states that "I know y'all think I'm crazy, but I'm not, I just want somebody from my family to tell me that I'm not confused and it's ok". Patient has a past medical history of anxiety and depression. Skin was assessed with Dedra SkeensGwen, RN and found to be clear of any abnormal marks. Patient searched and no contraband found, unit policies explained and understanding verbalized. Consents obtained. Food and fluids offered, and accepted. Patient had no additional questions or concerns at this time.

## 2017-05-30 NOTE — Progress Notes (Signed)
Recreation Therapy Notes  INPATIENT RECREATION THERAPY ASSESSMENT  Patient Details Name: Loma MessingKimberly K Carawan MRN: [id number] DOB: 02-Nov-1975 Today's Date: 05/30/2017  Patient Stressors: (SI, Alcohol)  Coping Skills:   (Sleeping)  Personal Challenges: Anger, Communication, Concentration, Decision-Making, Expressing Yourself, Problem-Solving, Relationships, Self-Esteem/Confidence, Social Interaction, Stress Management, Time Management, Substance Abuse  Leisure Interests (2+):  Music - Listen, Individual - TV(read the bible and spend time with God.)  Awareness of Community Resources:  Yes  Community Resources:  [employer]  Current Use: Yes  If no, Barriers?:    Patient Strengths:  I am a good person  Patient Identified Areas of Improvement:  Do not know  Current Recreation Participation:  N/A  Patient Goal for Hospitalization:  To figure out why I am here and how to get home.  [place]  County [place] Residence:  [place]   Current SI (including self-harm):  No  Current HI:  No  Consent to Intern Participation: N/A   Salil Raineri 05/30/2017, 10:55 AM

## 2017-05-30 NOTE — Tx Team (Signed)
Interdisciplinary Treatment and Diagnostic Plan Update  05/30/2017 Time of Session: 11:00am Megan MessingKimberly K Mejia MRN: [id number]  Principal Diagnosis: Severe recurrent major depressive disorder with psychotic features [facility](HCC)  Secondary Diagnoses: Principal Problem:   Severe recurrent major depressive disorder with psychotic features (HCC) Active Problems:   Anxiety state   Current Medications:  Current Facility-Administered Medications  Medication Dose Route Frequency Provider Last Rate Last Dose  . acetaminophen (TYLENOL) tablet 650 mg  650 mg Oral Q6H PRN [name], [name], MD      . alum & mag hydroxide-simeth (MAALOX/MYLANTA) 200-200-20 MG/5ML suspension 30 mL  30 mL Oral Q4H PRN [name], [name], MD      . hydrOXYzine (ATARAX/VISTARIL) tablet 25 mg  25 mg Oral TID PRN [name], [name], MD      . hydrOXYzine (ATARAX/VISTARIL) tablet 25 mg  25 mg Oral BID [name], Megan DenmarkJohn T, MD   25 mg at 05/30/17 0810  . magnesium hydroxide (MILK OF MAGNESIA) suspension 30 mL  30 mL Oral Daily PRN [name], [name], MD      . nicotine (NICODERM CQ - dosed in mg/24 hours) patch 21 mg  21 mg Transdermal Daily [name], Megan DenmarkJohn T, MD   21 mg at 05/30/17 0810  . risperiDONE (RISPERDAL) tablet 0.5 mg  0.5 mg Oral BID [name], Megan DenmarkJohn T, MD   0.5 mg at 05/30/17 0810  . topiramate (TOPAMAX) tablet 50 mg  50 mg Oral BID Megan Mejia, Megan Y, NP      . venlafaxine [facility](EFFEXOR) tablet 37.5 mg  37.5 mg Oral BID [facility] Megan Mejia, Megan Y, NP       PTA Medications: Medications Prior to Admission  Medication Sig Dispense Refill Last Dose  . HydrOXYzine HCl (ATARAX PO) Take by mouth.     . Topiramate (TOPAMAX PO) Take by mouth.     . Venlafaxine HCl (EFFEXOR XR PO) Take by mouth.       Patient Stressors: Financial difficulties Medication change or noncompliance  Patient Strengths: Ability for insight Active sense of humor Capable of independent living Communication skills Supportive family/friends  Treatment Modalities: Medication  Management, Group therapy, Case management,  1 to 1 session with clinician, Psychoeducation, Recreational therapy.   Physician Treatment Plan for Primary Diagnosis: Severe recurrent major depressive disorder with psychotic features (HCC) Long Term Goal(s): Improvement in symptoms so as ready for discharge   Short Term Goals: Ability to identify changes in lifestyle to reduce recurrence of condition will improve Ability to verbalize feelings will improve Ability to identify and develop effective coping behaviors will improve Ability to maintain clinical measurements within normal limits will improve Compliance with prescribed medications will improve Ability to identify triggers associated with substance abuse/mental health issues will improve  Medication Management: Evaluate patient's response, side effects, and tolerance of medication regimen.  Therapeutic Interventions: 1 to 1 sessions, Unit Group sessions and Medication administration.  Evaluation of Outcomes: Progressing  Physician Treatment Plan for Secondary Diagnosis: Principal Problem:   Severe recurrent major depressive disorder with psychotic features (HCC) Active Problems:   Anxiety state  Long Term Goal(s): Improvement in symptoms so as ready for discharge   Short Term Goals: Ability to identify changes in lifestyle to reduce recurrence of condition will improve Ability to verbalize feelings will improve Ability to identify and develop effective coping behaviors will improve Ability to maintain clinical measurements within normal limits will improve Compliance with prescribed medications will improve Ability to identify triggers associated with substance abuse/mental health issues will improve  Medication Management: Evaluate patient's response, side effects, and tolerance of medication regimen.  Therapeutic Interventions: 1 to 1 sessions, Unit Group sessions and Medication administration.  Evaluation of Outcomes:  Progressing   RN Treatment Plan for Primary Diagnosis: Severe recurrent major depressive disorder with psychotic features (HCC) Long Term Goal(s): Knowledge of disease and therapeutic regimen to maintain health will improve  Short Term Goals: Ability to participate in decision making will improve, Ability to identify and develop effective coping behaviors will improve and Compliance with prescribed medications will improve  Medication Management: RN will administer medications as ordered by provider, will assess and evaluate patient's response and provide education to patient for prescribed medication. RN will report any adverse and/or side effects to prescribing provider.  Therapeutic Interventions: 1 on 1 counseling sessions, Psychoeducation, Medication administration, Evaluate responses to treatment, Monitor vital signs and CBGs as ordered, Perform/monitor CIWA, COWS, AIMS and Fall Risk screenings as ordered, Perform wound care treatments as ordered.  Evaluation of Outcomes: Progressing   LCSW Treatment Plan for Primary Diagnosis: Severe recurrent major depressive disorder with psychotic features (HCC) Long Term Goal(s): Safe transition to appropriate next level of care at discharge, Engage patient in therapeutic group addressing interpersonal concerns.  Short Term Goals: Engage patient in aftercare planning with referrals and resources, Identify triggers associated with mental health/substance abuse issues and Increase skills for wellness and recovery  Therapeutic Interventions: Assess for all discharge needs, 1 to 1 time with Social worker, Explore available resources and support systems, Assess for adequacy in community support network, Educate family and significant other(s) on suicide prevention, Complete Psychosocial Assessment, Interpersonal group therapy.  Evaluation of Outcomes: Progressing   Progress in Treatment: Attending groups: Yes. Participating in groups: Yes. Taking  medication as prescribed: Yes. Toleration medication: Yes. Family/Significant other contact made: No, will contact:    Patient understands diagnosis: TBD-due to confusion Discussing patient identified problems/goals with staff: Yes. Medical problems stabilized or resolved: Yes. Denies suicidal/homicidal ideation: Yes. Issues/concerns per patient self-inventory: No. Other:    New problem(s) identified: No, Describe:     New Short Term/Long Term Goal(s):  Discharge Plan or Barriers: CSW is still assessing nfor appropriate follow up plan.  Reason for Continuation of Hospitalization: Medication stabilization  Estimated Length of Stay: 5-7 days  Recreational Therapy: Patient Stressors: SI, Alcohol  Patient Goal: Patient will identify 3 triggers for substance use x5 days.   Attendees: Patient:Megan Yehuda MaoWillard 05/30/2017 2:47 PM  Physician: Corinna GabHolly McNew, MD 05/30/2017 2:47 PM  Nursing: Hulan AmatoGwen Farrish, RN 05/30/2017 2:47 PM  RN Care Manager: 05/30/2017 2:47 PM  Social Worker: Jake SharkSara Laws, LCSW.  05/30/2017 2:47 PM  Recreational Therapist:  05/30/2017 2:47 PM  Other:  05/30/2017 2:47 PM  Other:  05/30/2017 2:47 PM  Other: 05/30/2017 2:47 PM    Scribe for Treatment Team: Glennon MacSara P Laws, LCSW 05/30/2017 2:47 PM

## 2017-05-30 NOTE — [facility] Group Notes (Signed)
  05/30/2017  Time: 0930  Type of Therapy/Topic:  Group Therapy:  Emotion Regulation  Participation Level:  Did Not Attend   Description of Group:    The purpose of this group is to assist patients in learning to regulate negative emotions and experience positive emotions. Patients will be guided to discuss ways in which they have been vulnerable to their negative emotions. These vulnerabilities will be juxtaposed with experiences of positive emotions or situations, and patients will be challenged to use positive emotions to combat negative ones. Special emphasis will be placed on coping with negative emotions in conflict situations, and patients will process healthy conflict resolution skills.  Therapeutic Goals: 1. Patient will identify two positive emotions or experiences to reflect on in order to balance out negative emotions 2. Patient will label two or more emotions that they find the most difficult to experience 3. Patient will demonstrate positive conflict resolution skills through discussion and/or role plays  Summary of Patient Progress:  Pt was invited to attend group but chose not to attend. CSW will continue to encourage pt to attend group throughout their admission.       Therapeutic Modalities:   Cognitive Behavioral Therapy Feelings Identification Dialectical Behavioral Therapy   Heidi DachKelsey Sadhana Frater, MSW, LCSW 05/30/2017 10:31 AM

## 2017-05-30 NOTE — [facility] Group Notes (Signed)
[facility] Group Notes:  (Nursing/MHT/Case Management/Adjunct)  Date:  05/30/2017  Time:  6:12 PM  Type of Therapy:  Psychoeducational Skills  Participation Level:  Active  Participation Quality:  Appropriate  Affect:  Flat  Cognitive:  Appropriate  Insight:  Lacking  Engagement in Group:  Limited  Modes of Intervention:  Discussion, Education and Exploration  Summary of Progress/Problems:  Judene CompanionMary  Arma Reining 05/30/2017, 6:12 PM

## 2017-05-30 NOTE — H&P (Signed)
Psychiatric Admission Assessment Adult  Patient Identification: Megan Mejia MRN:  [id number] Date of Evaluation:  05/30/2017 Chief Complaint:  psychosis Principal Diagnosis: Major depressive disorder, recurrent, severe with psychosis  Diagnosis:   Patient Active Problem List   Diagnosis Date Noted  . Acute psychosis ([place]) [F23] 05/28/2017    Priority: High   History of Present Illness: 41 yo female who presented to the ED in a state of acute psychosis.  Tearful on assessment and "feels alone in this world" despite having four children.  Two are adults with "their own lives", the other two are being raised by family.  Ms Purdon does receive support from her niece and sister.  She recently lost her job at [employer] due to taking things to "taking things".  Ms. Corral confessed because she felt guilty.  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  Reports using crack during the past month.  Frustrated with getting "help".  She was getting her medications via state funding but has not been able to see a doctor for the past 3 months due to relocation of her address.  Out of Effexor for the past couple of weeks which has increased her depression.  She feels she "just need back on my medications and find a new doctor."  Denies current suicidal/homicidal ideations.  Not responding to internal stimuli on assessment.  No withdrawal symptoms.  Reports getting out of jail/prison in March but no record noted in [place].  Denies sleep and appetite issues.  Relates her recent confusion/psychosis from "withdrawing" from her Effexor.   Associated Signs/Symptoms: Depression Symptoms:  depressed mood, feelings of worthlessness/guilt, difficulty concentrating, anxiety, (Hypo) Manic Symptoms:  Irritable Mood, Anxiety Symptoms:  Excessive Worry, Psychotic Symptoms:  none PTSD Symptoms: NA Total Time spent with patient: 45 minutes  Past Psychiatric History: depression, substance abuse,  anxiety  Is the patient at risk to self? No.  Has the patient been a risk to self in the past 6 months? Yes.    Has the patient been a risk to self within the distant past? Yes.    Is the patient a risk to others? No.  Has the patient been a risk to others in the past 6 months? No.  Has the patient been a risk to others within the distant past? No.   Prior Inpatient Therapy:  [facility] a few times Prior Outpatient Therapy:  [facility] in [place], [place]  Alcohol Screening: 1. How often do you have a drink containing alcohol?: Never 2. How many drinks containing alcohol do you have on a typical day when you are drinking?: 1 or 2 3. How often do you have six or more drinks on one occasion?: Never AUDIT-C Score: 0 4. How often during the last year have you found that you were not able to stop drinking once you had started?: Never 5. How often during the last year have you failed to do what was normally expected from you becasue of drinking?: Never 6. How often during the last year have you needed a first drink in the morning to get yourself going after a heavy drinking session?: Never 7. How often during the last year have you had a feeling of guilt of remorse after drinking?: Never 8. How often during the last year have you been unable to remember what happened the night before because you had been drinking?: Never 9. Have you or someone else been injured as a result of your drinking?: No  10. Has a relative or friend or a doctor or another health worker been concerned about your drinking or suggested you cut down?: No Alcohol Use Disorder Identification Test Final Score (AUDIT): 0 Intervention/Follow-up: AUDIT Score <7 follow-up not indicated Substance Abuse History in the last 12 months:  No. Consequences of Substance Abuse: Negative Previous Psychotropic Medications: None Psychological Evaluations: No  Past Medical History:  Past Medical History:  Diagnosis Date  . Anxiety   . Depression    . High cholesterol   . Hypertension     Past Surgical History:  Procedure Laterality Date  . TONSILLECTOMY     Family History: History reviewed. No pertinent family history. Family Psychiatric  History: denies  Tobacco Screening:   Social History:  Social History   Substance and Sexual Activity  Alcohol Use Yes   Comment: every day     Social History   Substance and Sexual Activity  Drug Use Yes  . Types: Marijuana    Additional Social History:      History of alcohol / drug use?: Yes                    Allergies:  No Known Allergies Lab Results:  Results for orders placed or performed during the hospital encounter of 05/29/17 (from the past 48 hour(s))  Basic metabolic panel     Status: Abnormal   Collection Time: 05/30/17  7:41 AM  Result Value Ref Range   Sodium 139 135 - 145 mmol/L   Potassium 3.5 3.5 - 5.1 mmol/L   Chloride 109 101 - 111 mmol/L   CO2 23 22 - 32 mmol/L   Glucose, Bld 100 (H) 65 - 99 mg/dL   BUN 14 6 - 20 mg/dL   Creatinine, Ser 0.64 0.44 - 1.00 mg/dL   Calcium 8.9 8.9 - 10.3 mg/dL   GFR calc non Af Amer >60 >60 mL/min   GFR calc Af Amer >60 >60 mL/min    Comment: (NOTE) The eGFR has been calculated using the CKD EPI equation. This calculation has not been validated in all clinical situations. eGFR's persistently <60 mL/min signify possible Chronic Kidney Disease.    Anion gap 7 5 - 15  Lipid panel     Status: Abnormal   Collection Time: 05/30/17  7:41 AM  Result Value Ref Range   Cholesterol 227 (H) 0 - 200 mg/dL   Triglycerides 256 (H) <150 mg/dL   HDL 33 (L) >40 mg/dL   Total CHOL/HDL Ratio 6.9 RATIO   VLDL 51 (H) 0 - 40 mg/dL   LDL Cholesterol 143 (H) 0 - 99 mg/dL    Comment:        Total Cholesterol/HDL:CHD Risk Coronary Heart Disease Risk Table                     Men   Women  1/2 Average Risk   3.4   3.3  Average Risk       5.0   4.4  2 X Average Risk   9.6   7.1  3 X Average Risk  23.4   11.0        Use the  calculated Patient Ratio above and the CHD Risk Table to determine the patient's CHD Risk.        ATP III CLASSIFICATION (LDL):  <100     mg/dL   Optimal  100-129  mg/dL   Near or Above  Optimal  130-159  mg/dL   Borderline  160-189  mg/dL   High  >190     mg/dL   Very High   TSH     Status: None   Collection Time: 05/30/17  7:41 AM  Result Value Ref Range   TSH 0.812 0.350 - 4.500 uIU/mL    Comment: Performed by a 3rd Generation assay with a functional sensitivity of <=0.01 uIU/mL.  Urinalysis, Complete w Microscopic     Status: Abnormal   Collection Time: 05/30/17 11:32 AM  Result Value Ref Range   Color, Urine YELLOW (A) YELLOW   APPearance CLEAR (A) CLEAR   Specific Gravity, Urine 1.014 1.005 - 1.030   pH 5.0 5.0 - 8.0   Glucose, UA NEGATIVE NEGATIVE mg/dL   Hgb urine dipstick NEGATIVE NEGATIVE   Bilirubin Urine NEGATIVE NEGATIVE   Ketones, ur NEGATIVE NEGATIVE mg/dL   Protein, ur NEGATIVE NEGATIVE mg/dL   Nitrite NEGATIVE NEGATIVE   Leukocytes, UA NEGATIVE NEGATIVE   RBC / HPF NONE SEEN 0 - 5 RBC/hpf   WBC, UA 0-5 0 - 5 WBC/hpf   Bacteria, UA NONE SEEN NONE SEEN   Squamous Epithelial / LPF 0-5 (A) NONE SEEN   Mucus PRESENT     Blood Alcohol level:  Lab Results  Component Value Date   ETH <10 38/46/6599    Metabolic Disorder Labs:  No results found for: HGBA1C, MPG No results found for: PROLACTIN Lab Results  Component Value Date   CHOL 227 (H) 05/30/2017   TRIG 256 (H) 05/30/2017   HDL 33 (L) 05/30/2017   CHOLHDL 6.9 05/30/2017   VLDL 51 (H) 05/30/2017   LDLCALC 143 (H) 05/30/2017   LDLCALC 112 (H) 12/27/2011    Current Medications: Current Facility-Administered Medications  Medication Dose Route Frequency Provider Last Rate Last Dose  . acetaminophen (TYLENOL) tablet 650 mg  650 mg Oral Q6H PRN [name], [name], MD      . alum & mag hydroxide-simeth (MAALOX/MYLANTA) 200-200-20 MG/5ML suspension 30 mL  30 mL Oral Q4H PRN [name],  [name], MD      . hydrOXYzine (ATARAX/VISTARIL) tablet 25 mg  25 mg Oral TID PRN [name], [name], MD      . hydrOXYzine (ATARAX/VISTARIL) tablet 25 mg  25 mg Oral BID [name], Madie Reno, MD   25 mg at 05/30/17 0810  . magnesium hydroxide (MILK OF MAGNESIA) suspension 30 mL  30 mL Oral Daily PRN [name], [name], MD      . nicotine (NICODERM CQ - dosed in mg/24 hours) patch 21 mg  21 mg Transdermal Daily [name], Madie Reno, MD   21 mg at 05/30/17 0810  . risperiDONE (RISPERDAL) tablet 0.5 mg  0.5 mg Oral BID [name], Madie Reno, MD   0.5 mg at 05/30/17 0810  . traZODone (DESYREL) tablet 100 mg  100 mg Oral QHS PRN [name], Madie Reno, MD   100 mg at 05/29/17 2119   PTA Medications: Medications Prior to Admission  Medication Sig Dispense Refill Last Dose  . HydrOXYzine HCl (ATARAX PO) Take by mouth.     . Topiramate (TOPAMAX PO) Take by mouth.     . Venlafaxine HCl (EFFEXOR XR PO) Take by mouth.       Musculoskeletal: Strength & Muscle Tone: within normal limits Gait & Station: normal Patient leans: N/A  Psychiatric Specialty Exam: Physical Exam  Constitutional: She is oriented to person, place, and time. She appears well-developed and well-nourished.  HENT:  Head:  Normocephalic.  Neck: Normal range of motion.  Respiratory: Effort normal.  Musculoskeletal: Normal range of motion.  Neurological: She is alert and oriented to person, place, and time.  Psychiatric: Her speech is normal and behavior is normal. Judgment and thought content normal. Her mood appears anxious. Cognition and memory are normal. She exhibits a depressed mood.    Review of Systems  Psychiatric/Behavioral: Positive for depression. The patient is nervous/anxious.   All other systems reviewed and are negative.   Blood pressure 106/72, pulse 90, temperature (!) 97.5 F (36.4 C), resp. rate 18, height 5' 1.42" (1.56 m), weight 68 kg (150 lb), last menstrual period 05/21/2017, SpO2 100 %.Body mass index is 27.96 kg/m.  General  Appearance: Casual  Eye Contact:  Fair  Speech:  Normal Rate  Volume:  Normal  Mood:  Depressed  Affect:  Congruent  Thought Process:  Coherent and Descriptions of Associations: Intact  Orientation:  Full (Time, Place, and Person)  Thought Content:  Rumination  Suicidal Thoughts:  No  Homicidal Thoughts:  No  Memory:  Immediate;   Fair Recent;   Fair Remote;   Fair  Judgement:  Poor  Insight:  Fair  Psychomotor Activity:  Decreased  Concentration:  Concentration: Fair and Attention Span: Fair  Recall:  [employer] of Knowledge:  Fair  Language:  Good  Akathisia:  No  Handed:  Right  AIMS (if indicated):     Assets:  Housing Leisure Time Physical Health Resilience Social Support  ADL's:  Intact  Cognition:  WNL  Sleep:  Number of Hours: 6.5    Treatment Plan Summary: Daily contact with patient to assess and evaluate symptoms and progress in treatment, Medication management and Plan major depressive disorder, recurrent, severe with psychosis:  Observation Level/Precautions:  15 minute checks  Laboratory:  completed in the ED, reviewed, stable  Psychotherapy:  Individual and group therapy  Medications:  See MAR  Consultations:  None  Discharge Concerns:  None  Estimated LOS:  5-7 days  Other:     Treatment summary:  41 yo female who presented to the ED with confusion and psychosis.  Medications were started and she cleared.  Today, she reports she stopped taking her Effexor recently and her depression along with confusion increased.  She had to change her address which cancelled her [facility] services and has not been able to connect to local providers.  Physician Treatment Plan for Primary Diagnosis: Major depressive disorder, recurrent, severe with psychosis.  Plan: -Started Effexor 37.5 mg BID for depression -Continued Risperdal 0.5 mg BID for psychosis -Started Topamax 50  Mg BID for mood  Anxiety disorder:  Plan:  Vistaril 25 mg BID for anxiety and Vistaril 25  mg TID PRN anxiety   Long Term Goal(s): Improvement in symptoms so as ready for discharge  Short Term Goals: Ability to identify changes in lifestyle to reduce recurrence of condition will improve, Ability to verbalize feelings will improve, Ability to identify and develop effective coping behaviors will improve, Ability to maintain clinical measurements within normal limits will improve, Compliance with prescribed medications will improve and Ability to identify triggers associated with substance abuse/mental health issues will improve   I certify that inpatient services furnished can reasonably be expected to improve the patient's condition.    Waylan Boga, NP 12/19/20181:55 PM

## 2017-05-30 NOTE — [facility] Suicide Risk Assessment (Signed)
[facility]   Nursing information obtained from:  Patient Demographic factors:  Divorced or widowed Current Mental Status:  Confused Loss Factors:  NA Historical Factors:  NA Risk Reduction Factors:  Living with another person, especially a relative  Total Time spent with patient: 20 minutes Principal Problem: Severe recurrent major depressive disorder with psychotic features (HCC) Diagnosis:   Patient Active Problem List   Diagnosis Date Noted  . Severe recurrent major depressive disorder with psychotic features (HCC) [F33.3] 05/30/2017  . Anxiety state [F41.1] 05/30/2017   Subjective Data: Pt Mejia that she has dealt with depression since she was very young. She Mejia that Effexor has been the only medication that has worked for her. She was up to 350 mg of IR Effexor in the past. She ran out of her prescription a few weeks ago so stopped it cold [place]. Since then she was feeling very confused and disoriented. She Mejia "I feel like the Megan Mejia is not the Megan Mejia." She Mejia that she feels very fearful and paranoid. She Mejia 'I know I'm in the hospital but I feel like I'm on a ship or something." Pt was seen in treatment team int he morning and very confused. Later in the day when I saw her, confusion improved drastically and she was able to give much better history. She still felt paranoid and confused and made bizarre comments at times but overall appeared better. Stopping the Effexor cold [place] may have caused an episode of delirium and psychosis. We discussed started XR version of Effexor but she prefers IR version because this has helped much more. She denies SI or any thoughts of self harm. She denies AH, VH. She drinks about "4 beers of Megan Mejia a day." Denies any benzo use.   Continued Clinical Symptoms:  Alcohol Use Disorder Identification Test Final Score (AUDIT): 0 The "Alcohol Use Disorders Identification Test", Guidelines for Use  in Primary Care, Second Edition.  World Science writerHealth Organization [facility](WHO). Score between 0-7:  no or low risk or alcohol related problems. Score between 8-15:  moderate risk of alcohol related problems. Score between 16-19:  high risk of alcohol related problems. Score 20 or above:  warrants further diagnostic evaluation for alcohol dependence and treatment.   CLINICAL FACTORS:   Confusion   Musculoskeletal: Strength & Muscle Tone: within normal limits Gait & Station: normal Patient leans: N/A  Psychiatric Specialty Exam: Physical Exam  ROS  Blood pressure 106/72, pulse 90, temperature (!) 97.5 F (36.4 C), resp. rate 18, height 5' 1.42" (1.56 m), weight 68 kg (150 lb), last menstrual period 05/21/2017, SpO2 100 %.Body mass index is 27.96 kg/m.  General Appearance: Casual  Eye Contact:  Good  Speech:  Clear and Coherent  Volume:  Normal  Mood:  Anxious and Depressed  Affect:  Congruent  Thought Process:  Goal Directed with some bizarre comments at times  Orientation:  Oriented to month, year president and location  Thought Content:  Paranoid Ideation  Suicidal Thoughts:  No  Homicidal Thoughts:  No  Memory:  Immediate;   Poor  Judgement:  Fair  Insight:  Fair  Psychomotor Activity:  Normal  Concentration:  Concentration: Fair  Recall:  Megan Mejia  Fund of Knowledge:  Fair  Language:  Fair  Akathisia:  No      Assets:  Communication Skills Desire for Improvement Resilience  ADL's:  Intact  Cognition:  Impaired,  Mild  Sleep:  Number of Hours: 6.5  COGNITIVE FEATURES THAT CONTRIBUTE TO RISK:  Thought constriction (tunnel vision)    SUICIDE RISK:   Minimal: No identifiable suicidal ideation.  Patients presenting with no risk factors but with morbid ruminations; may be classified as minimal risk based on the severity of the depressive symptoms  PLAN OF CARE:  -Will restart Effexor IR at 37.5 mg BID and increase as tolerated -Continue Risperdal 0.5 mg BID for  paranoia -Restart home Topamax but at lower dose of 5 mg BID. Pt requested to be back on this medication  I certify that inpatient services furnished can reasonably be expected to improve the patient's condition.   Haskell RilingHolly R Yocheved Depner, MD 05/30/2017, 3:56 PM

## 2017-05-31 DIAGNOSIS — F1210 Cannabis abuse, uncomplicated: Secondary | ICD-10-CM

## 2017-05-31 DIAGNOSIS — F29 Unspecified psychosis not due to a substance or known physiological condition: Secondary | ICD-10-CM

## 2017-05-31 DIAGNOSIS — F39 Unspecified mood [affective] disorder: Secondary | ICD-10-CM

## 2017-05-31 DIAGNOSIS — F17210 Nicotine dependence, cigarettes, uncomplicated: Secondary | ICD-10-CM

## 2017-05-31 DIAGNOSIS — F411 Generalized anxiety disorder: Secondary | ICD-10-CM

## 2017-05-31 DIAGNOSIS — R450 Nervousness: Secondary | ICD-10-CM

## 2017-05-31 NOTE — Progress Notes (Addendum)
Recreation Therapy Notes  Date: 12.20 .2018  Time: 3:00pm  Location: Craft room  Behavioral response: Appropriate  Group Type: Craft  Participation level: Active  Communication: Patient was social with peers and staff.  Comments: Patient left group early due to unknown reasons and never returned.  Tadarrius Burch LRT/CTRS        Thana Ramp 05/31/2017 4:28 PM

## 2017-05-31 NOTE — Progress Notes (Signed)
Patient ID: Megan Mejia, female   DOB: [birth year], 41 y.o.   MRN: [id number]   With patient's verbal permission, I Attempted to contact her niece, Donata ClayKarina for collateral. There was no answer and vague voicemail so unable to leave message. Her number is [phone]

## 2017-05-31 NOTE — [facility] Group Notes (Signed)
LCSW Group Therapy Note 05/31/2017 9:00 AM  Type of Therapy and Topic:  Group Therapy:  Setting Goals  Participation Level:  Minimal  Description of Group: In this process group, patients discussed using strengths to work toward goals and address challenges.  Patients identified two positive things about themselves and one goal they were working on.  Patients were given the opportunity to share openly and support each other's plan for self-empowerment.  The group discussed the value of gratitude and were encouraged to have a daily reflection of positive characteristics or circumstances.  Patients were encouraged to identify a plan to utilize their strengths to work on current challenges and goals.  Therapeutic Goals 1. Patient will verbalize personal strengths/positive qualities and relate how these can assist with achieving desired personal goals 2. Patients will verbalize affirmation of peers plans for personal change and goal setting 3. Patients will explore the value of gratitude and positive focus as related to successful achievement of goals 4. Patients will verbalize a plan for regular reinforcement of personal positive qualities and circumstances.  Summary of Patient Progress: Megan BradfordKimberly struggled some to actively participate in today's process goal she shared that she was not very "clear headed" so it was hard for him to put her thoughts into words.  She was able to identify one goal that she would like to work on today as "be more clear headed".        Therapeutic Modalities Cognitive Behavioral Therapy Motivational Interviewing    Megan FrameSonya S Paytyn Mejia, KentuckyLCS[place] 05/31/2017 11:44 AM

## 2017-05-31 NOTE — [facility] Suicide Risk Assessment (Signed)
[facility] INPATIENT:  Family/Significant Other Suicide Prevention Education  Suicide Prevention Education:  Contact Attempts: Mayra ReelKerina Byington, Niece [phone] and Girtha HakeKandy Chambers, Daughter [phone] has been identified by the patient as the family member/significant other with whom the patient will be residing, and identified as the person(s) who will aid the patient in the event of a mental health crisis.  With written consent from the patient, two attempts were made to provide suicide prevention education, prior to and/or following the patient's discharge.  We were unsuccessful in providing suicide prevention education.  A suicide education pamphlet was given to the patient to share with family/significant other.  Date and time of first attempt:Kerina Conchita ParisByington, Niece and Girtha HakeKandy Chambers, Daughter 05/31/2017 at 2:15pm  Johny ShearsCassandra  Myrissa Chipley 05/31/2017, 2:15 PM

## 2017-05-31 NOTE — Progress Notes (Addendum)
Recreation Therapy Notes  Date: 12.20.2018  Time: 1:00pm  Location: Craft Room  Behavioral response: Appropriate  Intervention Topic: Time Management  Discussion/Intervention: Group content today was focused on time management. The group defined time management and identified healthy ways to manage time. Individuals expressed how much of the 24 hours they use in a day. Patients expressed how much time they use just for themselves personally. The group expressed how they have managed their time in the past. Individuals participated in the intervention "Managing Life" where they had a chance to see how much of the 24 hours they use and where it goes. Clinical Observations/Feedback:  Patient came to group and stated that time management is planning. She is expressed that all her time is for her. Individual was pulled from group and never returned. Jenisis Harmsen LRT/CTRS        Truxton Stupka 05/31/2017 12:43 PM
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
## 2017-05-31 NOTE — Progress Notes (Signed)
[facility] MD Progress Note  05/31/2017 2:43 PM Megan Mejia  MRN:  [id number]   Subjective:  Patient reports no side effects from starting/restarting her medications.  Reports sleep as "fair", appetite as "good".  She is feeling anxious regarding discharge because she was hoping to discharge at 5 pm today despite just being admitted.  Ms Schone did remember the psychiatrist telling her that it might be over the weekend (Sat or Sun).  She wants "clean clothes", encouraged to call her family as her niece was coming to visit a 5 today to bring her some.  Also, suggested she utilize the washer and dryer on the unit.  Minimizes her issues at this time, focused on discharge.  Compliant with medications and group therapy.   Principal Problem: Severe recurrent major depressive disorder with psychotic features ([place]) Diagnosis:   Patient Active Problem List   Diagnosis Date Noted  . Severe recurrent major depressive disorder with psychotic features ([place]) [F33.3] 05/30/2017    Priority: High  . Anxiety state [F41.1] 05/30/2017    Priority: High   Total Time spent with patient: 30 minutes  Past Psychiatric History: depression, anxiety  Past Medical History:  Past Medical History:  Diagnosis Date  . Anxiety   . Depression   . High cholesterol   . Hypertension     Past Surgical History:  Procedure Laterality Date  . TONSILLECTOMY     Family History: History reviewed. No pertinent family history. Family Psychiatric  History: none Social History:  Social History   Substance and Sexual Activity  Alcohol Use Yes   Comment: every day     Social History   Substance and Sexual Activity  Drug Use Yes  . Types: Marijuana    Social History   Socioeconomic History  . Marital status: Divorced    Spouse name: None  . Number of children: None  . Years of education: None  . Highest education level: None  Social Needs  . Financial resource strain: None  . Food insecurity - worry: None  .  Food insecurity - inability: None  . Transportation needs - medical: None  . Transportation needs - non-medical: None  Occupational History  . None  Tobacco Use  . Smoking status: Current Every Day Smoker    Packs/day: 1.00    Types: Cigarettes  . Smokeless tobacco: Never Used  Substance and Sexual Activity  . Alcohol use: Yes    Comment: every day  . Drug use: Yes    Types: Marijuana  . Sexual activity: Not Currently  Other Topics Concern  . None  Social History Narrative  . None   Additional Social History:    History of alcohol / drug use?: Yes                    Sleep: Fair  Appetite:  Good  Current Medications: Current Facility-Administered Medications  Medication Dose Route Frequency Provider Last Rate Last Dose  . acetaminophen (TYLENOL) tablet 650 mg  650 mg Oral Q6H PRN [name], [name], MD      . alum & mag hydroxide-simeth (MAALOX/MYLANTA) 200-200-20 MG/5ML suspension 30 mL  30 mL Oral Q4H PRN [name], [name], MD      . hydrOXYzine (ATARAX/VISTARIL) tablet 25 mg  25 mg Oral TID PRN [name], Madie Reno, MD   25 mg at 05/30/17 2126  . hydrOXYzine (ATARAX/VISTARIL) tablet 25 mg  25 mg Oral BID [name], Madie Reno, MD   25 mg at 05/31/17  0812  . magnesium hydroxide (MILK OF MAGNESIA) suspension 30 mL  30 mL Oral Daily PRN [name], [name], MD      . nicotine (NICODERM CQ - dosed in mg/24 hours) patch 21 mg  21 mg Transdermal Daily [name], Madie Reno, MD   21 mg at 05/31/17 3790  . risperiDONE (RISPERDAL) tablet 0.5 mg  0.5 mg Oral BID [name], Madie Reno, MD   0.5 mg at 05/31/17 2409  . topiramate (TOPAMAX) tablet 50 mg  50 mg Oral BID Patrecia Pour, NP   50 mg at 05/31/17 7353  . venlafaxine [facility]) tablet 37.5 mg  37.5 mg Oral BID WC McNew, Tyson Babinski, MD   37.5 mg at 05/31/17 2992    Lab Results:  Results for orders placed or performed during the hospital encounter of 05/29/17 (from the past 48 hour(s))  Basic metabolic panel     Status: Abnormal   Collection Time:  05/30/17  7:41 AM  Result Value Ref Range   Sodium 139 135 - 145 mmol/L   Potassium 3.5 3.5 - 5.1 mmol/L   Chloride 109 101 - 111 mmol/L   CO2 23 22 - 32 mmol/L   Glucose, Bld 100 (H) 65 - 99 mg/dL   BUN 14 6 - 20 mg/dL   Creatinine, Ser 0.64 0.44 - 1.00 mg/dL   Calcium 8.9 8.9 - 10.3 mg/dL   GFR calc non Af Amer >60 >60 mL/min   GFR calc Af Amer >60 >60 mL/min    Comment: (NOTE) The eGFR has been calculated using the CKD EPI equation. This calculation has not been validated in all clinical situations. eGFR's persistently <60 mL/min signify possible Chronic Kidney Disease.    Anion gap 7 5 - 15  Hemoglobin A1c     Status: None   Collection Time: 05/30/17  7:41 AM  Result Value Ref Range   Hgb A1c MFr Bld 5.6 4.8 - 5.6 %    Comment: (NOTE) Pre diabetes:          5.7%-6.4% Diabetes:              >6.4% Glycemic control for   <7.0% adults with diabetes    Mean Plasma Glucose 114.02 mg/dL    Comment: Performed at [facility] [address] [place], [place] [address]  Lipid panel     Status: Abnormal   Collection Time: 05/30/17  7:41 AM  Result Value Ref Range   Cholesterol 227 (H) 0 - 200 mg/dL   Triglycerides 256 (H) <150 mg/dL   HDL 33 (L) >40 mg/dL   Total CHOL/HDL Ratio 6.9 RATIO   VLDL 51 (H) 0 - 40 mg/dL   LDL Cholesterol 143 (H) 0 - 99 mg/dL    Comment:        Total Cholesterol/HDL:CHD Risk Coronary Heart Disease Risk Table                     Men   Women  1/2 Average Risk   3.4   3.3  Average Risk       5.0   4.4  2 X Average Risk   9.6   7.1  3 X Average Risk  23.4   11.0        Use the calculated Patient Ratio above and the CHD Risk Table to determine the patient's CHD Risk.        ATP III CLASSIFICATION (LDL):  <100     mg/dL  Optimal  100-129  mg/dL   Near or Above                    Optimal  130-159  mg/dL   Borderline  160-189  mg/dL   High  >190     mg/dL   Very High   TSH     Status: None   Collection Time: 05/30/17  7:41 AM  Result  Value Ref Range   TSH 0.812 0.350 - 4.500 uIU/mL    Comment: Performed by a 3rd Generation assay with a functional sensitivity of <=0.01 uIU/mL.  Urinalysis, Complete w Microscopic     Status: Abnormal   Collection Time: 05/30/17 11:32 AM  Result Value Ref Range   Color, Urine YELLOW (A) YELLOW   APPearance CLEAR (A) CLEAR   Specific Gravity, Urine 1.014 1.005 - 1.030   pH 5.0 5.0 - 8.0   Glucose, UA NEGATIVE NEGATIVE mg/dL   Hgb urine dipstick NEGATIVE NEGATIVE   Bilirubin Urine NEGATIVE NEGATIVE   Ketones, ur NEGATIVE NEGATIVE mg/dL   Protein, ur NEGATIVE NEGATIVE mg/dL   Nitrite NEGATIVE NEGATIVE   Leukocytes, UA NEGATIVE NEGATIVE   RBC / HPF NONE SEEN 0 - 5 RBC/hpf   WBC, UA 0-5 0 - 5 WBC/hpf   Bacteria, UA NONE SEEN NONE SEEN   Squamous Epithelial / LPF 0-5 (A) NONE SEEN   Mucus PRESENT     Blood Alcohol level:  Lab Results  Component Value Date   ETH <10 79/15/0569    Metabolic Disorder Labs: Lab Results  Component Value Date   HGBA1C 5.6 05/30/2017   MPG 114.02 05/30/2017   No results found for: PROLACTIN Lab Results  Component Value Date   CHOL 227 (H) 05/30/2017   TRIG 256 (H) 05/30/2017   HDL 33 (L) 05/30/2017   CHOLHDL 6.9 05/30/2017   VLDL 51 (H) 05/30/2017   LDLCALC 143 (H) 05/30/2017   LDLCALC 112 (H) 12/27/2011    Physical Findings: AIMS: Facial and Oral Movements Muscles of Facial Expression: None, normal Lips and Perioral Area: None, normal Jaw: None, normal Tongue: None, normal,Extremity Movements Upper (arms, wrists, hands, fingers): None, normal Lower (legs, knees, ankles, toes): None, normal, Trunk Movements Neck, shoulders, hips: None, normal, Overall Severity Severity of abnormal movements (highest score from questions above): None, normal Incapacitation due to abnormal movements: None, normal Patient's awareness of abnormal movements (rate only patient's report): No Awareness, Dental Status Current problems with teeth and/or  dentures?: Yes Does patient usually wear dentures?: Yes  CIWA:  CIWA-Ar Total: 0 COWS:  COWS Total Score: 1  Musculoskeletal: Strength & Muscle Tone: within normal limits Gait & Station: normal Patient leans: N/A  Psychiatric Specialty Exam: Physical Exam  Constitutional: She is oriented to person, place, and time. She appears well-developed and well-nourished.  HENT:  Head: Normocephalic.  Neck: Normal range of motion.  Respiratory: Effort normal.  Musculoskeletal: Normal range of motion.  Neurological: She is alert and oriented to person, place, and time.  Psychiatric: Her speech is normal and behavior is normal. Judgment and thought content normal. Her mood appears anxious. Cognition and memory are normal. She exhibits a depressed mood.    Review of Systems  Psychiatric/Behavioral: Positive for depression. The patient is nervous/anxious.   All other systems reviewed and are negative.   Blood pressure 126/87, pulse (!) 106, temperature 98.7 F (37.1 C), temperature source Oral, resp. rate 18, height 5' 1.42" (1.56 m), weight 68 kg (150 lb), last menstrual  period 05/21/2017, SpO2 100 %.Body mass index is 27.96 kg/m.  General Appearance: Casual  Eye Contact:  Fair  Speech:  Normal Rate  Volume:  Normal  Mood:  Anxious and Depressed  Affect:  Congruent  Thought Process:  Coherent and Descriptions of Associations: Intact  Orientation:  Full (Time, Place, and Person)  Thought Content:  WDL and Logical  Suicidal Thoughts:  No  Homicidal Thoughts:  No  Memory:  Immediate;   Fair Recent;   Fair Remote;   Fair  Judgement:  Fair  Insight:  Fair  Psychomotor Activity:  Decreased  Concentration:  Concentration: Fair and Attention Span: Fair  Recall:  [employer] of Knowledge:  Fair  Language:  Good  Akathisia:  No  Handed:  Right  AIMS (if indicated):     Assets:  Leisure Time Physical Health Resilience Social Support  ADL's:  Intact  Cognition:  WNL  Sleep:  Number of  Hours: 6.3     Treatment Plan Summary: Daily contact with patient to assess and evaluate symptoms and progress in treatment, Medication management and Plan major depressive disorder, recurrent, severe without psychosis:  Plan: Continue Effexor 37.5 mg BID for depression and anxiety Continue Risperdal 0.5 mg BID for mood and psychosis Continue Topamax 50 mg BID for mood stabilization  Anxiety d/o Plan: Continue Vistaril 25 mg BID and 25 mg TID PRN anxiety   Cristobal Advani, NP 05/31/2017, 2:43 PM

## 2017-05-31 NOTE — BHH Group Notes (Signed)
[facility] Group Notes:  (Nursing/MHT/Case Management/Adjunct)  Date:  05/31/2017  Time:  5:39 PM  Type of Therapy:  Psychoeducational Skills  Participation Level:  Minimal  Participation Quality:  Appropriate and Attentive  Affect:  Appropriate and Flat  Cognitive:  Alert and Appropriate  Insight:  Limited  Engagement in Group:  Limited  Modes of Intervention:  Discussion and Education  Summary of Progress/Problems:  Judene CompanionMary  Yeison Sippel 05/31/2017, 5:39 PM

## 2017-05-31 NOTE — Plan of Care (Signed)
Patient affect is tense and flat.  Avoids eye contact.  Speech is clipped and gives short one word answers.  Medication compliant.  Continues to exhibits signs or paranoia.  Isolates to self.  Support offered.  Safety rounds maintained.

## 2017-05-31 NOTE — Progress Notes (Signed)
Pt. States having a good day.  Upon engaging patient, patient stated "I have a Headache and feeling anxious".  Pt. Given prn medications.  Pt. Grateful, and had no further complaints.  Pt. Maintained good eye contact.  Pt. Denies SI/HI and A/VH.

## 2017-05-31 NOTE — Progress Notes (Signed)
Patient was seen interacting and  socializing  with peers in the common area this evening. Participated in groups and assertive patient states that ' I feel less depressed when   i take my medicines' patient sleep long hours

## 2017-05-31 NOTE — B[facility] Group Notes (Signed)
05/31/2017  Time: 1:00PM  Type of Therapy/Topic:  Group Therapy:  Balance in Life  Participation Level:  Did Not Attend  Description of Group:   This group will address the concept of balance and how it feels and looks when one is unbalanced. Patients will be encouraged to process areas in their lives that are out of balance and identify reasons for remaining unbalanced. Facilitators will guide patients in utilizing problem-solving interventions to address and correct the stressor making their life unbalanced. Understanding and applying boundaries will be explored and addressed for obtaining and maintaining a balanced life. Patients will be encouraged to explore ways to assertively make their unbalanced needs known to significant others in their lives, using other group members and facilitator for support and feedback.  Therapeutic Goals: 1. Patient will identify two or more emotions or situations they have that consume much of in their lives. 2. Patient will identify signs/triggers that life has become out of balance:  3. Patient will identify two ways to set boundaries in order to achieve balance in their lives:  4. Patient will demonstrate ability to communicate their needs through discussion and/or role plays  Summary of Patient Progress: Pt was invited to attend group but chose not to attend. CSW will continue to encourage pt to attend group throughout their admission.   Therapeutic Modalities:   Cognitive Behavioral Therapy Solution-Focused Therapy Assertiveness Training   Heidi DachKelsey Ajanee Buren, MSW, LCSW 05/31/2017 1:49 PM
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
## 2017-05-31 NOTE — [facility] Suicide Risk Assessment (Signed)
[facility] INPATIENT:  Family/Significant Other Suicide Prevention Education  Suicide Prevention Education:  Education Completed; Megan HakeKandy Chambers, Daughter, [phone] has been identified by the patient as the family member/significant other with whom the patient will be residing, and identified as the person(s) who will aid the patient in the event of a mental health crisis (suicidal ideations/suicide attempt).  With written consent from the patient, the family member/significant other has been provided the following suicide prevention education, prior to the and/or following the discharge of the patient.  The suicide prevention education provided includes the following:  Suicide risk factors  Suicide prevention and interventions  National Suicide Hotline telephone number  [facility] assessment telephone number  [facility] [place] Emergency Assistance 911  [facility] and/or Residential Mobile Crisis Unit telephone number  Request made of family/significant other to:  Remove weapons (e.g., guns, rifles, knives), all items previously/currently identified as safety concern.    Remove drugs/medications (over-the-counter, prescriptions, illicit drugs), all items previously/currently identified as a safety concern.  The family member/significant other verbalizes understanding of the suicide prevention education information provided.  The family member/significant other agrees to remove the items of safety concern listed above.  Patients daughter says with Schizophrenia runs in their family. Patients father had this disorder. She mentions when speaking with her the patient was very confused. Patients daughter says that her mother has been delusional with thoughts of demons bing after. Daughter reports that there are no guns or weapons in the home.  Johny ShearsCassandra  Megan Mejia 05/31/2017, 2:17 PM

## 2017-06-01 MED ORDER — RISPERIDONE 1 MG PO TABS: 1.0000 mg | ORAL_TABLET | Freq: Two times a day (BID) | ORAL | Status: DC

## 2017-06-01 MED ORDER — RISPERIDONE 1 MG PO TABS
1.0000 mg | ORAL_TABLET | Freq: Two times a day (BID) | ORAL | Status: DC
  Administered 2017-06-01 – 2017-06-04 (×7): 1 mg via ORAL
  Filled 2017-06-01 (×7): qty 1

## 2017-06-01 MED ORDER — RISPERIDONE 0.5 MG PO TABS: 0.5000 mg | ORAL_TABLET | Freq: Two times a day (BID) | ORAL | 0 refills | Status: DC

## 2017-06-01 MED ORDER — VENLAFAXINE HCL 37.5 MG PO TABS
37.5000 mg | ORAL_TABLET | Freq: Once | ORAL | Status: AC
  Administered 2017-06-01: 37.5 mg via ORAL
  Filled 2017-06-01: qty 1

## 2017-06-01 MED ORDER — VENLAFAXINE HCL 75 MG PO TABS: 75.0000 mg | ORAL_TABLET | Freq: Two times a day (BID) | ORAL | 0 refills | Status: DC

## 2017-06-01 MED ORDER — RISPERIDONE 1 MG PO TABS: 1.0000 mg | ORAL_TABLET | Freq: Two times a day (BID) | ORAL | 0 refills | Status: DC

## 2017-06-01 MED ORDER — RISPERIDONE 0.5 MG PO TABS: 1.0000 mg | ORAL_TABLET | Freq: Two times a day (BID) | ORAL | 0 refills | Status: DC

## 2017-06-01 MED ORDER — HYDROXYZINE HCL 25 MG PO TABS: 25.0000 mg | ORAL_TABLET | Freq: Three times a day (TID) | ORAL | 0 refills | Status: DC | PRN

## 2017-06-01 MED ORDER — VENLAFAXINE HCL 37.5 MG PO TABS
75.0000 mg | ORAL_TABLET | Freq: Two times a day (BID) | ORAL | Status: DC
  Administered 2017-06-02 – 2017-06-08 (×14): 75 mg via ORAL
  Filled 2017-06-01 (×13): qty 2
  Filled 2017-06-01: qty 1
  Filled 2017-06-01: qty 2

## 2017-06-01 NOTE — [facility] Group Notes (Signed)
06/01/2017 1pm Type of Therapy and Topic:  Group Therapy:  Feelings around Relapse and Recovery  Participation Level:  Active   Description of Group:    Patients in this group will discuss emotions they experience before and after a relapse. They will process how experiencing these feelings, or avoidance of experiencing them, relates to having a relapse. Facilitator will guide patients to explore emotions they have related to recovery. Patients will be encouraged to process which emotions are more powerful. They will be guided to discuss the emotional reaction significant others in their lives may have to patients' relapse or recovery. Patients will be assisted in exploring ways to respond to the emotions of others without this contributing to a relapse.  Therapeutic Goals: 1. Patient will identify two or more emotions that lead to a relapse for them 2. Patient will identify two emotions that result when they relapse 3. Patient will identify two emotions related to recovery 4. Patient will demonstrate ability to communicate their needs through discussion and/or role plays   Summary of Patient Progress:  Actively and appropriately engaged in the group. Patient was able to provide support and validation to other group members.Patient practiced active listening when interacting with the facilitator and other group members Patient in still in the process of obtaining treatment goals. Megan BradfordKimberly says that when she came to the hospital she felt "abandoned by my family and hopeless." She reports having goals of "putting my pride aside and asking my family for help when I need it."   Therapeutic Modalities:   Cognitive Behavioral Therapy Solution-Focused Therapy Assertiveness Training Relapse Prevention Therapy   Johny ShearsCassandra  Hydie Langan, LCSW 06/01/2017 1:57 PM

## 2017-06-01 NOTE — Progress Notes (Signed)
Recreation Therapy Notes  Date: 12.21.2018  Time: 9:30 am  Location: Craft Room  Behavioral response: Appropriate  Intervention Topic: Leisure  Discussion/Intervention: Group content today was focused on leisure. The group defined what leisure is and some positive leisure activities they participate in. Individuals identified the difference between good and bad leisure. Participants expressed how they feel after participating in the leisure of their choice. The group discussed how they go about picking a leisure activity and if others are involved in their leisure activities. The patient stated how many leisure activities they too choose from and reasons why it is important to have leisure time. Individuals participated in the intervention "Pick a leisure" where they had a chance to pick a leisure activity they would like to participate in. Clinical Observations/Feedback:  Patient came to group and stated she like to participate in reading and crafts during her leisure time. Individual stated that leisure should be positive and that she tried to made a bad situation good.She stated when she wants to participate in a leisure activity she just does it. Patient described how being in charge of her life is a good thing for her and she explained leisure is time for yourself. Individual participated in the intervention and was social with peers and staff during group.  Megan Mejia LRT/CTRS         Megan Mejia 06/01/2017 10:44 AM

## 2017-06-01 NOTE — [facility] Group Notes (Signed)
[facility] Group Notes:  (Nursing/MHT/Case Management/Adjunct)  Date:  06/01/2017  Time:  10:24 PM  Type of Therapy:  Evening Wrap-up Group  Participation Level:  Active  Participation Quality:  Appropriate and Attentive  Affect:  Appropriate  Cognitive:  Alert and Appropriate  Insight:  Appropriate, Good and Improving  Engagement in Group:  Developing/Improving and Engaged  Modes of Intervention:  Discussion, Socialization and Support  Summary of Progress/Problems:  Tomasita MorrowChelsea Nanta Hermann Dottavio 06/01/2017, 10:24 PM

## 2017-06-01 NOTE — Progress Notes (Signed)
[facility]  06/01/2017 9:09 AM Loma MessingKimberly K Menefee  MRN:  [id number] Subjective:  Pt states that she is starting to feel better. Mood is improving since being back on medications. She still feels somewhat confused but this is improving. When asked to give an example, she states, "Well I fel like I'm my mom sometimes." She states, "I know that's weird but I think she might be with me and comforting me because I'm sick." She is much more oriented today. She knows month, year, president and that she is in the hospital. She Denies SI or any thoughts of self harm. Denies HI, AH, VH. She had a good visit with her niece yesterday. She wants to discharge before Christmas and wants to spend time with family. She plans to stay with her niece. I have attempted to call her niece again today but no answer.   Principal Problem: Severe recurrent major depressive disorder with psychotic features (HCC) Diagnosis:   Patient Active Problem List   Diagnosis Date Noted  . Severe recurrent major depressive disorder with psychotic features (HCC) [F33.3] 05/30/2017  . Anxiety state [F41.1] 05/30/2017   Total Time spent with patient: 20 minutes  Past Psychiatric History: See H7P  Past Medical History:  Past Medical History:  Diagnosis Date  . Anxiety   . Depression   . High cholesterol   . Hypertension     Past Surgical History:  Procedure Laterality Date  . TONSILLECTOMY     Family History: History reviewed. No pertinent family history. Family Psychiatric  History: See H&P Social History:  Social History   Substance and Sexual Activity  Alcohol Use Yes   Comment: every day     Social History   Substance and Sexual Activity  Drug Use Yes  . Types: Marijuana    Social History   Socioeconomic History  . Marital status: Divorced    Spouse name: None  . Number of children: None  . Years of education: None  . Highest education level: None  Social Needs  . Financial resource strain:  None  . Food insecurity - worry: None  . Food insecurity - inability: None  . Transportation needs - medical: None  . Transportation needs - non-medical: None  Occupational History  . None  Tobacco Use  . Smoking status: Current Every Day Smoker    Packs/day: 1.00    Types: Cigarettes  . Smokeless tobacco: Never Used  Substance and Sexual Activity  . Alcohol use: Yes    Comment: every day  . Drug use: Yes    Types: Marijuana  . Sexual activity: Not Currently  Other Topics Concern  . None  Social History Narrative  . None   Additional Social History:    History of alcohol / drug use?: Yes                    Sleep: Good  Appetite:  Good  Current Medications: Current Facility-Administered Medications  Medication Dose Route Frequency Provider Last Rate Last Dose  . acetaminophen (TYLENOL) tablet 650 mg  650 mg Oral Q6H PRN [name], [name], MD   650 mg at 05/31/17 1952  . alum & mag hydroxide-simeth (MAALOX/MYLANTA) 200-200-20 MG/5ML suspension 30 mL  30 mL Oral Q4H PRN [name], [name], MD      . hydrOXYzine (ATARAX/VISTARIL) tablet 25 mg  25 mg Oral TID PRN [name], [name], MD   25 mg at 05/31/17 1953  . magnesium hydroxide (MILK OF  MAGNESIA) suspension 30 mL  30 mL Oral Daily PRN [name], [name], MD      . nicotine (NICODERM CQ - dosed in mg/24 hours) patch 21 mg  21 mg Transdermal Daily [name], [name], MD   21 mg at 06/01/17 0819  . risperiDONE (RISPERDAL) tablet 0.5 mg  0.5 mg Oral BID [name], [name], MD   0.5 mg at 06/01/17 0820  . venlafaxine (EFFEXOR) tablet 37.5 mg  37.5 mg Oral Once Haskell Riling, MD      . Melene Muller ON 06/02/2017] venlafaxine [facility]) tablet 75 mg  75 mg Oral BID WC Mailynn Everly, Ileene Hutchinson, MD        Lab Results:  Results for orders placed or performed during the hospital encounter of 05/29/17 (from the past 48 hour(s))  Urinalysis, Complete w Microscopic     Status: Abnormal   Collection Time: 05/30/17 11:32 AM  Result Value Ref Range   Color,  Urine YELLOW (A) YELLOW   APPearance CLEAR (A) CLEAR   Specific Gravity, Urine 1.014 1.005 - 1.030   pH 5.0 5.0 - 8.0   Glucose, UA NEGATIVE NEGATIVE mg/dL   Hgb urine dipstick NEGATIVE NEGATIVE   Bilirubin Urine NEGATIVE NEGATIVE   Ketones, ur NEGATIVE NEGATIVE mg/dL   Protein, ur NEGATIVE NEGATIVE mg/dL   Nitrite NEGATIVE NEGATIVE   Leukocytes, UA NEGATIVE NEGATIVE   RBC / HPF NONE SEEN 0 - 5 RBC/hpf   WBC, UA 0-5 0 - 5 WBC/hpf   Bacteria, UA NONE SEEN NONE SEEN   Squamous Epithelial / LPF 0-5 (A) NONE SEEN   Mucus PRESENT     Blood Alcohol level:  Lab Results  Component Value Date   ETH <10 05/27/2017    Metabolic Disorder Labs: Lab Results  Component Value Date   HGBA1C 5.6 05/30/2017   MPG 114.02 05/30/2017   No results found for: PROLACTIN Lab Results  Component Value Date   CHOL 227 (H) 05/30/2017   TRIG 256 (H) 05/30/2017   HDL 33 (L) 05/30/2017   CHOLHDL 6.9 05/30/2017   VLDL 51 (H) 05/30/2017   LDLCALC 143 (H) 05/30/2017   LDLCALC 112 (H) 12/27/2011    Physical Findings: AIMS: Facial and Oral Movements Muscles of Facial Expression: None, normal Lips and Perioral Area: None, normal Jaw: None, normal Tongue: None, normal,Extremity Movements Upper (arms, wrists, hands, fingers): None, normal Lower (legs, knees, ankles, toes): None, normal, Trunk Movements Neck, shoulders, hips: None, normal, Overall Severity Severity of abnormal movements (highest score from questions above): None, normal Incapacitation due to abnormal movements: None, normal Patient's awareness of abnormal movements (rate only patient's report): No Awareness, Dental Status Current problems with teeth and/or dentures?: Yes Does patient usually wear dentures?: Yes  CIWA:  CIWA-Ar Total: 0 COWS:  COWS Total Score: 1  Musculoskeletal: Strength & Muscle Tone: within normal limits Gait & Station: normal Patient leans: N/A  Psychiatric Specialty Exam: Physical Exam  Nursing note and  vitals reviewed.   Review of Systems  All other systems reviewed and are negative.   Blood pressure 110/84, pulse 89, temperature 98.4 F (36.9 C), temperature source Oral, resp. rate 18, height 5' 1.42" (1.56 m), weight 68 kg (150 lb), last menstrual period 05/21/2017, SpO2 100 %.Body mass index is 27.96 kg/m.  General Appearance: Casual  Eye Contact:  Good  Speech:  Clear and Coherent  Volume:  Normal  Mood:  Depressed but improving  Affect:  Congruent  Thought Process:  Coherent and Goal Directed  Orientation:  Full (Time, Place, and Person)  Thought Content:  Negative  Suicidal Thoughts:  No  Homicidal Thoughts:  No  Memory:  Immediate;   Fair  Judgement:  Fair  Insight:  Fair  Psychomotor Activity:  Normal  Concentration:  Concentration: Fair  Recall:  [employer]Fair  Fund of Knowledge:  Fair  Language:  Fair  Akathisia:  No      Assets:  Communication Skills Desire for Improvement Resilience  ADL's:  Intact  Cognition:  WNL  Sleep:  Number of Hours: 8     Treatment Plan Summary: 41 yo female admitted due to acute confusion after abruptly stopping Effexor due to running out. Psychosis is improving but still appears paranoid and slightly confused at times. She is tolerating medications well.   Plan:  Psychosis -Increase Risperdal to 1 mg BID  Mood _Increase Effexor to 75 mg BID starting tomorrow -d/c Topamax due to potential to cause cognitive dulling  Anxiety -d/c scheduled vistaril -prn vistaril  Dispo -Pt plans to stay with her niece on discharge. I have tried to call her niece again today with no answer.   Haskell RilingHolly R Charlina Dwight, MD 06/01/2017, 9:09 AM

## 2017-06-01 NOTE — [facility] Group Notes (Signed)
[facility] Group Notes:  (Nursing/MHT/Case Management/Adjunct)  Date:  06/01/2017  Time:  3:10 PM  Type of Therapy:  Psychoeducational Skills  Participation Level:  Active  Participation Quality:  Appropriate, Attentive and Sharing  Affect:  Appropriate and Tearful  Cognitive:  Alert and Appropriate  Insight:  Appropriate and Good  Engagement in Group:  Engaged  Modes of Intervention:  Discussion, Education and Socialization  Summary of Progress/Problems:  Judene CompanionMary  Terena Bohan 06/01/2017, 3:10 PM

## 2017-06-01 NOTE — Progress Notes (Signed)
Patient was not wanting to talk at this time. CH will follow-up after afternoon group.

## 2017-06-01 NOTE — Progress Notes (Signed)
Received [name] this am after breakfast, she was compliant with her medications. She denied all of the psychiatric symptoms and stated she feels ready to be discharge home. She is attending the group therapy sessions and has been visible in the milieu at intervals and for her meals. No change in status this PM.

## 2017-06-01 NOTE — [facility] Group Notes (Signed)
[facility] Group Notes:  (Nursing/MHT/Case Management/Adjunct)  Date:  06/01/2017  Time:  5:00 AM  Type of Therapy:  Psychoeducational Skills  Participation Level:  Active  Participation Quality:  Appropriate and Attentive  Affect:  Appropriate  Cognitive:  Appropriate  Insight:  Appropriate and Good  Engagement in Group:  Engaged  Modes of Intervention:  Discussion, Socialization and Support  Summary of Progress/Problems:  Chancy MilroyLaquanda Y Estephan Gallardo 06/01/2017, 5:00 AM

## 2017-06-01 NOTE — [facility] Counselor (Signed)
Social Worker and patient completed an application for [facility]. Referral was faxed over.

## 2017-06-02 DIAGNOSIS — F333 Major depressive disorder, recurrent, severe with psychotic symptoms: Principal | ICD-10-CM

## 2017-06-02 NOTE — Plan of Care (Signed)
Patient up on the unit social with some peers. She denies si, hi,avh. Safety maintained. No behavior issues noted. Will continue to monitor.

## 2017-06-02 NOTE — [facility] Group Notes (Signed)
[facility] Group Notes:  (Nursing/MHT/Case Management/Adjunct)  Date:  06/02/2017  Time:  9:36 PM  Type of Therapy:  Group Therapy  Participation Level:  Active  Participation Quality:  Appropriate  Affect:  Appropriate  Cognitive:  Alert  Insight:  Good  Engagement in Group:  Engaged  Modes of Intervention:  Support  Summary of Progress/Problems:  Megan Mejia 06/02/2017, 9:36 PM

## 2017-06-02 NOTE — Plan of Care (Signed)
No group attendance.  Visible in the milieu.  No inappropriate behavior noted.  Maintains personal care chores appropriately.

## 2017-06-02 NOTE — Progress Notes (Signed)
[facility]  06/02/2017 1:53 PM Megan Mejia  MRN:  [id number] Subjective: Patient endorses to me that she would like to go home.  Advises me that we can contact her daughter if her knees does not pick up the phone.  Says her knees works a lot and so she will not be available to pick up the phone mostly.  But her daughter can pick up the form. Patient takes a moment and tells me that she feels afraid for her life.  Feels that there is a group of people who are out to get her and will not let her living piece.  Patient says she is afraid to be in the hospital and would like to go home so that she can be safe.  On further probing patient reveals that she believes that the Dorcas Carrowmafia is out to get her.  Patient surveys the environment around her looks above and below, says that could be cameras fitted inside the room that could be observing and listening to what she is saying.  Endorses that the Dorcas Carrowmafia is out to get her and she is not sure if she will be safe in the hospital or outside the hospital.  Says that is a lot more but she cannot tell me because it is not safe to do so.  Tells me that she cannot trust anybody.  Collateral from daughter Drue FlirtCandy [phone] -endorses that patient has been sounding weird over the phone and she does not feel safe to take patient home.  Since patient was seeing demons and talking nonsense before she came into the hospital.  Principal Problem: Severe recurrent major depressive disorder with psychotic features [facility](HCC) Diagnosis:   Patient Active Problem List   Diagnosis Date Noted  . Severe recurrent major depressive disorder with psychotic features (HCC) [F33.3] 05/30/2017  . Anxiety state [F41.1] 05/30/2017   Total Time spent with patient: 30 minutes  Past Psychiatric History: See H7P  Past Medical History:  Past Medical History:  Diagnosis Date  . Anxiety   . Depression   . High cholesterol   . Hypertension     Past Surgical History:  Procedure  Laterality Date  . TONSILLECTOMY     Family History: History reviewed. No pertinent family history. Family Psychiatric  History: See H&P Social History:  Social History   Substance and Sexual Activity  Alcohol Use Yes   Comment: every day     Social History   Substance and Sexual Activity  Drug Use Yes  . Types: Marijuana    Social History   Socioeconomic History  . Marital status: Divorced    Spouse name: None  . Number of children: None  . Years of education: None  . Highest education level: None  Social Needs  . Financial resource strain: None  . Food insecurity - worry: None  . Food insecurity - inability: None  . Transportation needs - medical: None  . Transportation needs - non-medical: None  Occupational History  . None  Tobacco Use  . Smoking status: Current Every Day Smoker    Packs/day: 1.00    Types: Cigarettes  . Smokeless tobacco: Never Used  Substance and Sexual Activity  . Alcohol use: Yes    Comment: every day  . Drug use: Yes    Types: Marijuana  . Sexual activity: Not Currently  Other Topics Concern  . None  Social History Narrative  . None   Additional Social History:    History of alcohol /  drug use?: Yes                    Sleep: Good  Appetite:  Good  Current Medications: Current Facility-Administered Medications  Medication Dose Route Frequency Provider Last Rate Last Dose  . acetaminophen (TYLENOL) tablet 650 mg  650 mg Oral Q6H PRN [name], [name], MD   650 mg at 06/01/17 1613  . alum & mag hydroxide-simeth (MAALOX/MYLANTA) 200-200-20 MG/5ML suspension 30 mL  30 mL Oral Q4H PRN [name], [name], MD      . hydrOXYzine (ATARAX/VISTARIL) tablet 25 mg  25 mg Oral TID PRN [name], [name], MD   25 mg at 05/31/17 1953  . magnesium hydroxide (MILK OF MAGNESIA) suspension 30 mL  30 mL Oral Daily PRN [name], [name], MD      . nicotine (NICODERM CQ - dosed in mg/24 hours) patch 21 mg  21 mg Transdermal Daily [name], [name], MD    21 mg at 06/02/17 0831  . risperiDONE (RISPERDAL) tablet 1 mg  1 mg Oral BID McNew, Ileene HutchinsonHolly R, MD   1 mg at 06/02/17 0831  . venlafaxine (EFFEXOR) tablet 75 mg  75 mg Oral BID WC McNew, Ileene HutchinsonHolly R, MD   75 mg at 06/02/17 0831    Lab Results:  No results found for this or any previous visit (from the past 48 hour(s)).  Blood Alcohol level:  Lab Results  Component Value Date   ETH <10 05/27/2017    Metabolic Disorder Labs: Lab Results  Component Value Date   HGBA1C 5.6 05/30/2017   MPG 114.02 05/30/2017   No results found for: PROLACTIN Lab Results  Component Value Date   CHOL 227 (H) 05/30/2017   TRIG 256 (H) 05/30/2017   HDL 33 (L) 05/30/2017   CHOLHDL 6.9 05/30/2017   VLDL 51 (H) 05/30/2017   LDLCALC 143 (H) 05/30/2017   LDLCALC 112 (H) 12/27/2011    Physical Findings: AIMS: Facial and Oral Movements Muscles of Facial Expression: None, normal Lips and Perioral Area: None, normal Jaw: None, normal Tongue: None, normal,Extremity Movements Upper (arms, wrists, hands, fingers): None, normal Lower (legs, knees, ankles, toes): None, normal, Trunk Movements Neck, shoulders, hips: None, normal, Overall Severity Severity of abnormal movements (highest score from questions above): None, normal Incapacitation due to abnormal movements: None, normal Patient's awareness of abnormal movements (rate only patient's report): No Awareness, Dental Status Current problems with teeth and/or dentures?: Yes Does patient usually wear dentures?: Yes  CIWA:  CIWA-Ar Total: 0 COWS:  COWS Total Score: 1  Musculoskeletal: Strength & Muscle Tone: within normal limits Gait & Station: normal Patient leans: N/A  Psychiatric Specialty Exam: Physical Exam  Nursing note and vitals reviewed.   Review of Systems  All other systems reviewed and are negative.   Blood pressure 123/82, pulse (!) 108, temperature 98.5 F (36.9 C), temperature source Oral, resp. rate 18, height 5' 1.42" (1.56 m),  weight 150 lb (68 kg), last menstrual period 05/21/2017, SpO2 100 %.Body mass index is 27.96 kg/m.  General Appearance: Casual  Eye Contact:  Good  Speech:  Clear and Coherent  Volume:  Normal  Mood:  Depressed, tearful during interview  Affect:  Congruent  Thought Process: Disorganized, psychotic  Orientation:  Full (Time, Place, and Person)  Thought Content:  Negative  Suicidal Thoughts:  No  Homicidal Thoughts:  No  Memory:  Immediate;   Fair  Judgement:  Fair  Insight:  Fair  Psychomotor Activity:  Normal  Concentration:  Concentration: Fair  Recall:  [employer] of Knowledge:  Fair  Language:  Fair  Akathisia:  No      Assets:  Communication Skills Desire for Improvement Resilience  ADL's:  Intact  Cognition:  WNL  Sleep:  Number of Hours: 8     Treatment Plan Summary: 41 yo female with a history of psychosis, presented to the ER after being grossly psychotic.  Compliance with medications has been doubtful in the past.  Although possibly scheduled for discharge, patient today reveals to me dilution of reference and persecution that seems to be systematized.  Is concerned about her safety secondary to the delusion.  Collateral from daughter also indicates that patient is not safe to return home.  Plan:  Psychosis -Continue Risperdal at 1 mg BID for now.  Will evaluate need to increase to 2 mg twice daily in the next 2-3 days.  Mood Continue Effexor 75 mg twice daily -d/c Topamax due to potential to cause cognitive dulling  Anxiety -d/c scheduled vistaril -prn vistaril  Dispo -Daughter will come get patient when she is safe for discharge.  Aundria Rud, MD 06/02/2017, 1:53 PM

## 2017-06-02 NOTE — Plan of Care (Signed)
Patient up on the unit but isolates to self. She is pleasant and cooperative. She denies si, hi, avh. Denies pain, but does complain of anxiety. When asked if something had happened she stated it just came about. Prn vistaril given. Patient resting after medication. Safety maintained. Will continue to monitor.

## 2017-06-02 NOTE — [facility] Group Notes (Signed)

## 2017-06-03 MED ORDER — BUTALBITAL-APAP-CAFFEINE 50-325-40 MG PO TABS
1.0000 | ORAL_TABLET | Freq: Four times a day (QID) | ORAL | Status: DC | PRN
  Administered 2017-06-03 – 2017-06-08 (×14): 1 via ORAL
  Filled 2017-06-03 (×16): qty 1

## 2017-06-03 NOTE — Progress Notes (Signed)
[facility] MD Progress Note  06/03/2017 12:09 PM Megan Mejia  MRN:  [id number] Subjective: Today patient endorses that she is not sure why she spoke about feeling concerned about her safety yesterday.  Says it is highly possible this was a one-time thing and not a thought that she has consistently.  Would like to leave on Christmas day.  At one point she endorsed that she cannot trust anybody, and then quickly reverted to same that what she said was a nonsensical thought.  Asks if her topiramate can be restarted  Because she continues to have a headache and says this was the recent topiramate was started in the first place.  Collateral from daughter Megan Mejia [phone] -endorses that patient has been sounding weird over the phone and she does not feel safe to take patient home.  Since patient was seeing demons and talking nonsense before she came into the hospital.  Principal Problem: Severe recurrent major depressive disorder with psychotic features [facility]) Diagnosis:   Patient Active Problem List   Diagnosis Date Noted  . Severe recurrent major depressive disorder with psychotic features (HCC) [F33.3] 05/30/2017  . Anxiety state [F41.1] 05/30/2017   Total Time spent with patient: 30 minutes  Past Psychiatric History: See H7P  Past Medical History:  Past Medical History:  Diagnosis Date  . Anxiety   . Depression   . High cholesterol   . Hypertension     Past Surgical History:  Procedure Laterality Date  . TONSILLECTOMY     Family History: History reviewed. No pertinent family history. Family Psychiatric  History: See H&P Social History:  Social History   Substance and Sexual Activity  Alcohol Use Yes   Comment: every day     Social History   Substance and Sexual Activity  Drug Use Yes  . Types: Marijuana    Social History   Socioeconomic History  . Marital status: Divorced    Spouse name: None  . Number of children: None  . Years of education: None  . Highest  education level: None  Social Needs  . Financial resource strain: None  . Food insecurity - worry: None  . Food insecurity - inability: None  . Transportation needs - medical: None  . Transportation needs - non-medical: None  Occupational History  . None  Tobacco Use  . Smoking status: Current Every Day Smoker    Packs/day: 1.00    Types: Cigarettes  . Smokeless tobacco: Never Used  Substance and Sexual Activity  . Alcohol use: Yes    Comment: every day  . Drug use: Yes    Types: Marijuana  . Sexual activity: Not Currently  Other Topics Concern  . None  Social History Narrative  . None   Additional Social History:    History of alcohol / drug use?: Yes                    Sleep: Good  Appetite:  Good  Current Medications: Current Facility-Administered Medications  Medication Dose Route Frequency Provider Last Rate Last Dose  . acetaminophen (TYLENOL) tablet 650 mg  650 mg Oral Q6H PRN [name], [name], MD   650 mg at 06/02/17 1707  . alum & mag hydroxide-simeth (MAALOX/MYLANTA) 200-200-20 MG/5ML suspension 30 mL  30 mL Oral Q4H PRN [name], [name], MD      . hydrOXYzine (ATARAX/VISTARIL) tablet 25 mg  25 mg Oral TID PRN [name], [name], MD   25 mg at 06/02/17 2144  .  magnesium hydroxide (MILK OF MAGNESIA) suspension 30 mL  30 mL Oral Daily PRN [name], [name], MD      . nicotine (NICODERM CQ - dosed in mg/24 hours) patch 21 mg  21 mg Transdermal Daily [name], [name], MD   21 mg at 06/03/17 0807  . risperiDONE (RISPERDAL) tablet 1 mg  1 mg Oral BID Haskell RilingMcNew, Holly R, MD   1 mg at 06/03/17 0806  . venlafaxine (EFFEXOR) tablet 75 mg  75 mg Oral BID WC McNew, Ileene HutchinsonHolly R, MD   75 mg at 06/03/17 16100806    Lab Results:  No results found for this or any previous visit (from the past 48 hour(s)).  Blood Alcohol level:  Lab Results  Component Value Date   ETH <10 05/27/2017    Metabolic Disorder Labs: Lab Results  Component Value Date   HGBA1C 5.6 05/30/2017   MPG  114.02 05/30/2017   No results found for: PROLACTIN Lab Results  Component Value Date   CHOL 227 (H) 05/30/2017   TRIG 256 (H) 05/30/2017   HDL 33 (L) 05/30/2017   CHOLHDL 6.9 05/30/2017   VLDL 51 (H) 05/30/2017   LDLCALC 143 (H) 05/30/2017   LDLCALC 112 (H) 12/27/2011    Physical Findings: AIMS: Facial and Oral Movements Muscles of Facial Expression: None, normal Lips and Perioral Area: None, normal Jaw: None, normal Tongue: None, normal,Extremity Movements Upper (arms, wrists, hands, fingers): None, normal Lower (legs, knees, ankles, toes): None, normal, Trunk Movements Neck, shoulders, hips: None, normal, Overall Severity Severity of abnormal movements (highest score from questions above): None, normal Incapacitation due to abnormal movements: None, normal Patient's awareness of abnormal movements (rate only patient's report): No Awareness, Dental Status Current problems with teeth and/or dentures?: Yes Does patient usually wear dentures?: Yes  CIWA:  CIWA-Ar Total: 0 COWS:  COWS Total Score: 1  Musculoskeletal: Strength & Muscle Tone: within normal limits Gait & Station: normal Patient leans: N/A  Psychiatric Specialty Exam: Physical Exam  Nursing note and vitals reviewed.   Review of Systems  All other systems reviewed and are negative.   Blood pressure 108/81, pulse 90, temperature 98.8 F (37.1 C), temperature source Oral, resp. rate 16, height 5' 1.42" (1.56 m), weight 150 lb (68 kg), last menstrual period 05/21/2017, SpO2 99 %.Body mass index is 27.96 kg/m.  General Appearance: Casual  Eye Contact:  Good  Speech:  Clear and Coherent  Volume:  Normal  Mood: Concerned about safety  Affect:  Congruent  Thought Process: Disorganized, psychotic  Orientation:  Full (Time, Place, and Person)  Thought Content:  Negative  Suicidal Thoughts:  No  Homicidal Thoughts:  No  Memory:  Immediate;   Fair  Judgement:  Fair  Insight:  Fair  Psychomotor Activity:   Normal  Concentration:  Concentration: Fair  Recall:  [employer]Fair  Fund of Knowledge:  Fair  Language:  Fair  Akathisia:  No      Assets:  Communication Skills Desire for Improvement Resilience  ADL's:  Intact  Cognition:  WNL  Sleep:  Number of Hours: 8     Treatment Plan Summary: 41 yo female with a history of psychosis, presented to the ER after being grossly psychotic.  Compliance with medications has been doubtful in the past.  Although possibly scheduled for discharge, patient today reveals to me dilution of reference and persecution that seems to be systematized.  Is concerned about her safety secondary to the delusion.  Collateral from daughter also indicates that patient  is not safe to return home.  On 06/02/2017 patient provided me details about her delusion.  Today she endorses that when she said was completely nonsensical and was an isolated thought that she had.  On one hand it is impressive that she has a insight to be conflicted about these thoughts.  On the other hand it does appear that she knows that she will not be discharged if she continues to talk about her delusion.  Plan:  Psychosis -Continue Risperdal at 1 mg BID for now.  Will evaluate need to increase to 2 mg twice daily in the next 2-3 days.  Mood Continue Effexor 75 mg twice daily -d/c Topamax due to potential to cause cognitive dulling  Anxiety -d/c scheduled vistaril -prn vistaril  Headache - Fioricet tablet  (caffeine, acetaminophen, butalbital) combination 1 tablet every 6 hours as needed  Dispo -Daughter will come get patient when she is safe for discharge.  Aundria RudGopalkumar Neville Walston, MD 06/03/2017, 12:09 PM

## 2017-06-03 NOTE — [facility] Group Notes (Signed)
LCSW Group Therapy Note 06/03/2017 1:15pm Type of Therapy and Topic: Group Therapy: Feelings Around Returning Home & Establishing a Supportive Framework and Supporting Oneself When Supports Not Available Participation Level: Active Description of Group:  Patients first processed thoughts and feelings about upcoming discharge. These included fears of upcoming changes, lack of change, new living environments, judgements and expectations from others and overall stigma of mental health issues. The group then discussed the definition of a supportive framework, what that looks and feels like, and how do to discern it from an unhealthy non-supportive network. The group identified different types of supports as well as what to do when your family/friends are less than helpful or unavailable  Therapeutic Goals  1. Patient will identify one healthy supportive network that they can use at discharge. 2. Patient will identify one factor of a supportive framework and how to tell it from an unhealthy network. 3. Patient able to identify one coping skill to use when they do not have positive supports from others. 4. Patient will demonstrate ability to communicate their needs through discussion and/or role plays.  Summary of Patient Progress:  Pt engaged actively during group session. As patients processed their anxiety about discharge and described healthy supports patient shared that she is ready to go back home, she stated she has a great support system. Patient identified at least one self-care tool they were willing to use after discharge.   Therapeutic Modalities Cognitive Behavioral Therapy Motivational Interviewing   Kandi Brusseau  CUEBAS-COLON, LCSW 06/03/2017 12:16 PM

## 2017-06-03 NOTE — Progress Notes (Signed)
Pt c/o headache 10/10 and anxiety. Pt given prn per MD order. Will cont to monitor pt.

## 2017-06-03 NOTE — Plan of Care (Signed)
Pt is currently sitting in her room. Pt denies ah/vh/si/hi at this time. No s/sx of distressed noted/voiced. Pt engage with peers on the unit. Will cont to monitor pt.

## 2017-06-04 MED ORDER — RISPERIDONE 1 MG PO TABS
2.0000 mg | ORAL_TABLET | Freq: Two times a day (BID) | ORAL | Status: DC
  Administered 2017-06-04 – 2017-06-08 (×9): 2 mg via ORAL
  Filled 2017-06-04 (×9): qty 2

## 2017-06-04 NOTE — Progress Notes (Signed)
Recreation Therapy Notes  Date: 12.24.2018  Time: 1:00pm   Location: Craft Room  Behavioral response: Appropriate  Intervention Topic: Stress  Discussion/Intervention: Group content on today was focused on stress. The group defined stress and way to cope with stress. Participants expressed how they know when they are stresses out. Individuals described the different ways they have to cope with stress. The group stated reasons why it is important to cope with stress. Patient explained what good stress is and some examples. The group participated in the intervention "Less stress", where the group got to work with their peers and not focus on their stressors.   Clinical Observations/Feedback:  Patient came to group and expressed that when she is stressed out she likes to go for walks and read. She participated in the intervention and was social with peers and staff during group.  Daimion Adamcik LRT/CTRS         Ayris Carano 06/04/2017 1:47 PM

## 2017-06-04 NOTE — Progress Notes (Addendum)
[facility]  06/04/2017 2:01 PM Megan Mejia  MRN:  [id number] Subjective: Patient endorses that one of the other patients on the unit is actually somebody else on the outside.  Endorses to me that a neighbor has morphed himself into this patient and is here with her on the unit.  Endorses to me that it is possible her boyfriend is doing an elaborate plan to surprise of her Christmas and part of the plan is making her think that the Dorcas Carrowmafia is out to get her.  Wants to go home, but feels unsafe that the [place] will attack her when she is on the outside. Denies any suicidal thoughts.  Patient request for discharge, but is amenable to be engaged in a discussion about her psychopathology.  Principal Problem: Severe recurrent major depressive disorder with psychotic features (HCC) Diagnosis:   Patient Active Problem List   Diagnosis Date Noted  . Severe recurrent major depressive disorder with psychotic features (HCC) [F33.3] 05/30/2017  . Anxiety state [F41.1] 05/30/2017   Total Time spent with patient: 30 minutes  Past Psychiatric History: See H7P  Past Medical History:  Past Medical History:  Diagnosis Date  . Anxiety   . Depression   . High cholesterol   . Hypertension     Past Surgical History:  Procedure Laterality Date  . TONSILLECTOMY     Family History: History reviewed. No pertinent family history. Family Psychiatric  History: See H&P Social History:  Social History   Substance and Sexual Activity  Alcohol Use Yes   Comment: every day     Social History   Substance and Sexual Activity  Drug Use Yes  . Types: Marijuana    Social History   Socioeconomic History  . Marital status: Divorced    Spouse name: None  . Number of children: None  . Years of education: None  . Highest education level: None  Social Needs  . Financial resource strain: None  . Food insecurity - worry: None  . Food insecurity - inability: None  . Transportation needs -  medical: None  . Transportation needs - non-medical: None  Occupational History  . None  Tobacco Use  . Smoking status: Current Every Day Smoker    Packs/day: 1.00    Types: Cigarettes  . Smokeless tobacco: Never Used  Substance and Sexual Activity  . Alcohol use: Yes    Comment: every day  . Drug use: Yes    Types: Marijuana  . Sexual activity: Not Currently  Other Topics Concern  . None  Social History Narrative  . None   Additional Social History:    History of alcohol / drug use?: Yes                    Sleep: Good  Appetite:  Good  Current Medications: Current Facility-Administered Medications  Medication Dose Route Frequency Provider Last Rate Last Dose  . acetaminophen (TYLENOL) tablet 650 mg  650 mg Oral Q6H PRN [name], [name], MD   650 mg at 06/04/17 [id number]  . alum & mag hydroxide-simeth (MAALOX/MYLANTA) 200-200-20 MG/5ML suspension 30 mL  30 mL Oral Q4H PRN [name], [name], MD      . butalbital-acetaminophen-caffeine (FIORICET, ESGIC) [phone] MG per tablet 1 tablet  1 tablet Oral Q6H PRN Aundria Rudakesh, Acea Yagi, MD   1 tablet at 06/03/17 2035  . hydrOXYzine (ATARAX/VISTARIL) tablet 25 mg  25 mg Oral TID PRN [name], [name], MD   25 mg at  06/04/17 0820  . magnesium hydroxide (MILK OF MAGNESIA) suspension 30 mL  30 mL Oral Daily PRN [name], [name], MD      . nicotine (NICODERM CQ - dosed in mg/24 hours) patch 21 mg  21 mg Transdermal Daily [name], [name], MD   21 mg at 06/04/17 0820  . risperiDONE (RISPERDAL) tablet 2 mg  2 mg Oral BID Aundria Rudakesh, Shanta Hartner, MD      . venlafaxine [facility](EFFEXOR) tablet 75 mg  75 mg Oral BID WC McNew, Ileene HutchinsonHolly R, MD   75 mg at 06/04/17 0820    Lab Results:  No results found for this or any previous visit (from the past 48 hour(s)).  Blood Alcohol level:  Lab Results  Component Value Date   ETH <10 05/27/2017    Metabolic Disorder Labs: Lab Results  Component Value Date   HGBA1C 5.6 05/30/2017   MPG 114.02 05/30/2017   No  results found for: PROLACTIN Lab Results  Component Value Date   CHOL 227 (H) 05/30/2017   TRIG 256 (H) 05/30/2017   HDL 33 (L) 05/30/2017   CHOLHDL 6.9 05/30/2017   VLDL 51 (H) 05/30/2017   LDLCALC 143 (H) 05/30/2017   LDLCALC 112 (H) 12/27/2011    Physical Findings: AIMS: Facial and Oral Movements Muscles of Facial Expression: None, normal Lips and Perioral Area: None, normal Jaw: None, normal Tongue: None, normal,Extremity Movements Upper (arms, wrists, hands, fingers): None, normal Lower (legs, knees, ankles, toes): None, normal, Trunk Movements Neck, shoulders, hips: None, normal, Overall Severity Severity of abnormal movements (highest score from questions above): None, normal Incapacitation due to abnormal movements: None, normal Patient's awareness of abnormal movements (rate only patient's report): No Awareness, Dental Status Current problems with teeth and/or dentures?: Yes Does patient usually wear dentures?: Yes  CIWA:  CIWA-Ar Total: 0 COWS:  COWS Total Score: 1  Musculoskeletal: Strength & Muscle Tone: within normal limits Gait & Station: normal Patient leans: N/A  Psychiatric Specialty Exam: Physical Exam  Nursing note and vitals reviewed.   Review of Systems  All other systems reviewed and are negative.   Blood pressure 119/81, pulse 96, temperature 99 F (37.2 C), temperature source Oral, resp. rate 16, height 5' 1.42" (1.56 m), weight 150 lb (68 kg), last menstrual period 05/21/2017, SpO2 99 %.Body mass index is 27.96 kg/m.  General Appearance: Casual  Eye Contact:  Good  Speech:  Clear and Coherent  Volume:  Normal  Mood: Concerned about safety  Affect:  Congruent  Thought Process: Disorganized, psychotic  Orientation:  Full (Time, Place, and Person)  Thought Content:  Negative  Suicidal Thoughts:  No  Homicidal Thoughts:  No  Memory:  Immediate;   Fair  Judgement:  Fair  Insight:  Fair  Psychomotor Activity:  Normal  Concentration:   Concentration: Fair  Recall:  [employer]ervFair  Fund of Knowledge:  Fair  Language:  Fair  Akathisia:  No      Assets:  Communication Skills Desire for Improvement Resilience  ADL's:  Intact  Cognition:  WNL  Sleep:  Number of Hours: 4     Treatment Plan Summary: 41 yo female with a history of psychosis, presented to the ER after being grossly psychotic.  Compliance with medications has been doubtful in the past.  Although possibly scheduled for discharge, patient today reveals to me dilution of reference and persecution that seems to be systematized.  Is concerned about her safety secondary to the delusion.  Collateral from daughter also indicates that patient  is not safe to return home. Patient continues to have this elaborate delusional belief which involves multiple layers and is well systematized.  The delusional belief involves being persecuted by the mafia, mistaken identities and concern about her safety.  Plan:  Psychosis -Increase Risperdal to 2 mg twice daily from tonight..    Mood Continue Effexor 75 mg twice daily -d/c Topamax due to potential to cause cognitive dulling  Anxiety -d/c scheduled vistaril -prn vistaril  Headache - Fioricet tablet  (caffeine, acetaminophen, butalbital) combination 1 tablet every 6 hours as needed  Dispo -Daughter will come get patient when she is safe for discharge.  TSH and A1c are unremarkable.  Lipid panel elevated.  Aundria Rud, MD 06/04/2017, 2:01 PM

## 2017-06-04 NOTE — [facility] Group Notes (Signed)
06/04/2017 9:30AM Type of Therapy and Topic:  Group Therapy:  Overcoming Obstacles  Participation Level:  Did Not Attend    Description of Group:    In this group patients will be encouraged to explore what they see as obstacles to their own wellness and recovery. They will be guided to discuss their thoughts, feelings, and behaviors related to these obstacles. The group will process together ways to cope with barriers, with attention given to specific choices patients can make. Each patient will be challenged to identify changes they are motivated to make in order to overcome their obstacles. This group will be process-oriented, with patients participating in exploration of their own experiences as well as giving and receiving support and challenge from other group members.   Therapeutic Goals: 1. Patient will identify personal and current obstacles as they relate to admission. 2. Patient will identify barriers that currently interfere with their wellness or overcoming obstacles.  3. Patient will identify feelings, thought process and behaviors related to these barriers. 4. Patient will identify two changes they are willing to make to overcome these obstacles:      Summary of Patient Progress  Patient was encouraged and invited to attend group. Patient did not attend group. Social worker will continue to encourage group participation in the future.    Therapeutic Modalities:   Cognitive Behavioral Therapy Solution Focused Therapy Motivational Interviewing Relapse Prevention Therapy    Johny ShearsCassandra Raciel Caffrey MSW, LCSWA 06/04/2017 11:18 AM

## 2017-06-04 NOTE — Progress Notes (Signed)
D: Patient denies SI/HI/AVH. Patient has complaint of Anxiety and Headache. Patient received PRN medications. Patient engages with peers in milieu A: Patient received PRN medications. Q15 minutes safety rounds completed  R: Patient has no complaints at this time.

## 2017-06-04 NOTE — Progress Notes (Signed)
Recreation Therapy Notes   Date: 12.24 .2018  Time: 3:00pm  Location: Craft room  Behavioral response: Appropriate  Group Type: Craft  Participation level: Active  Communication: Patient was social with peers and staff.  Comments: N/A  Megan Mejia LRT/CTRS        Megan Mejia 06/04/2017 3:57 PM 

## 2017-06-04 NOTE — Tx Team (Signed)
Interdisciplinary Treatment and Diagnostic Plan Update  06/04/2017 Time of Session: 10:30am Megan Mejia MRN: [id number]  Principal Diagnosis: Severe recurrent major depressive disorder with psychotic features [facility](HCC)  Secondary Diagnoses: Principal Problem:   Severe recurrent major depressive disorder with psychotic features [facility](HCC) Active Problems:   Anxiety state   Current Medications:  Current Facility-Administered Medications  Medication Dose Route Frequency Provider Last Rate Last Dose  . acetaminophen (TYLENOL) tablet 650 mg  650 mg Oral Q6H PRN [name], [name], MD   650 mg at 06/04/17 [id number]  . alum & mag hydroxide-simeth (MAALOX/MYLANTA) 200-200-20 MG/5ML suspension 30 mL  30 mL Oral Q4H PRN [name], [name], MD      . butalbital-acetaminophen-caffeine (FIORICET, ESGIC) [phone] MG per tablet 1 tablet  1 tablet Oral Q6H PRN Aundria Rudakesh, Gopalkumar, MD   1 tablet at 06/03/17 2035  . hydrOXYzine (ATARAX/VISTARIL) tablet 25 mg  25 mg Oral TID PRN [name], [name], MD   25 mg at 06/04/17 0820  . magnesium hydroxide (MILK OF MAGNESIA) suspension 30 mL  30 mL Oral Daily PRN [name], [name], MD      . nicotine (NICODERM CQ - dosed in mg/24 hours) patch 21 mg  21 mg Transdermal Daily [name], [name], MD   21 mg at 06/04/17 0820  . risperiDONE (RISPERDAL) tablet 2 mg  2 mg Oral BID Aundria Rudakesh, Gopalkumar, MD      . venlafaxine [facility](EFFEXOR) tablet 75 mg  75 mg Oral BID WC McNew, Ileene HutchinsonHolly R, MD   75 mg at 06/04/17 0820   PTA Medications: Medications Prior to Admission  Medication Sig Dispense Refill Last Dose  . HydrOXYzine HCl (ATARAX PO) Take by mouth.     . Topiramate (TOPAMAX PO) Take by mouth.     . Venlafaxine HCl (EFFEXOR XR PO) Take by mouth.       Patient Stressors: Financial difficulties Medication change or noncompliance  Patient Strengths: Ability for insight Active sense of humor Capable of independent living Communication skills Supportive family/friends  Treatment Modalities:  Medication Management, Group therapy, Case management,  1 to 1 session with clinician, Psychoeducation, Recreational therapy.   Physician Treatment Plan for Primary Diagnosis: Severe recurrent major depressive disorder with psychotic features (HCC) Long Term Goal(s): Improvement in symptoms so as ready for discharge   Short Term Goals: Ability to identify changes in lifestyle to reduce recurrence of condition will improve Ability to verbalize feelings will improve Ability to identify and develop effective coping behaviors will improve Ability to maintain clinical measurements within normal limits will improve Compliance with prescribed medications will improve Ability to identify triggers associated with substance abuse/mental health issues will improve  Medication Management: Evaluate patient's response, side effects, and tolerance of medication regimen.  Therapeutic Interventions: 1 to 1 sessions, Unit Group sessions and Medication administration.  Evaluation of Outcomes: Progressing  Physician Treatment Plan for Secondary Diagnosis: Principal Problem:   Severe recurrent major depressive disorder with psychotic features (HCC) Active Problems:   Anxiety state  Long Term Goal(s): Improvement in symptoms so as ready for discharge   Short Term Goals: Ability to identify changes in lifestyle to reduce recurrence of condition will improve Ability to verbalize feelings will improve Ability to identify and develop effective coping behaviors will improve Ability to maintain clinical measurements within normal limits will improve Compliance with prescribed medications will improve Ability to identify triggers associated with substance abuse/mental health issues will improve     Medication Management: Evaluate patient's response, side effects, and tolerance  of medication regimen.  Therapeutic Interventions: 1 to 1 sessions, Unit Group sessions and Medication administration.  Evaluation  of Outcomes: Progressing   RN Treatment Plan for Primary Diagnosis: Severe recurrent major depressive disorder with psychotic features (HCC) Long Term Goal(s): Knowledge of disease and therapeutic regimen to maintain health will improve  Short Term Goals: Ability to participate in decision making will improve, Ability to identify and develop effective coping behaviors will improve and Compliance with prescribed medications will improve  Medication Management: RN will administer medications as ordered by provider, will assess and evaluate patient's response and provide education to patient for prescribed medication. RN will report any adverse and/or side effects to prescribing provider.  Therapeutic Interventions: 1 on 1 counseling sessions, Psychoeducation, Medication administration, Evaluate responses to treatment, Monitor vital signs and CBGs as ordered, Perform/monitor CIWA, COWS, AIMS and Fall Risk screenings as ordered, Perform wound care treatments as ordered.  Evaluation of Outcomes: Progressing   LCSW Treatment Plan for Primary Diagnosis: Severe recurrent major depressive disorder with psychotic features (HCC) Long Term Goal(s): Safe transition to appropriate next level of care at discharge, Engage patient in therapeutic group addressing interpersonal concerns.  Short Term Goals: Engage patient in aftercare planning with referrals and resources, Identify triggers associated with mental health/substance abuse issues and Increase skills for wellness and recovery  Therapeutic Interventions: Assess for all discharge needs, 1 to 1 time with Social worker, Explore available resources and support systems, Assess for adequacy in community support network, Educate family and significant other(s) on suicide prevention, Complete Psychosocial Assessment, Interpersonal group therapy.  Evaluation of Outcomes: Progressing   Progress in Treatment: Attending groups: Yes. Participating in groups:  Yes. Taking medication as prescribed: Yes. Toleration medication: Yes. Family/Significant other contact made: No, will contact:    Patient understands diagnosis: TBD-due to confusion Discussing patient identified problems/goals with staff: Yes. Medical problems stabilized or resolved: Yes. Denies suicidal/homicidal ideation: Yes. Issues/concerns per patient self-inventory: No. Other:    New problem(s) identified: No, Describe:     New Short Term/Long Term Goal(s):  Discharge Plan or Barriers: CSW is still assessing nfor appropriate follow up plan.  Reason for Continuation of Hospitalization: Medication stabilization  Estimated Length of Stay: 2-3 days  Recreational Therapy: Patient Stressors: SI, Alcohol  Patient Goal: Patient will identify 3 triggers for substance use x5 days.   Attendees: Patient:Megan Mejia 06/04/2017 11:35 AM  Physician: Dr. Kathy Breachakesh MD 06/04/2017 11:35 AM  Nursing: Leonia ReaderPhyllis Cobb RN 06/04/2017 11:35 AM  RN Care Manager: 06/04/2017 11:35 AM  Social Worker: [name] Adoni Greenough, LCSW  06/04/2017 11:35 AM  Recreational Therapist: Judithann SheenShae Outlaw, LRT 06/04/2017 11:35 AM  Other: Heidi DachKelsey Craig, LCSW 06/04/2017 11:35 AM  Other:  06/04/2017 11:35 AM  Other: 06/04/2017 11:35 AM    Scribe for Treatment Team: [name]  Ramir Malerba, LCSW 06/04/2017 11:35 AM

## 2017-06-04 NOTE — Plan of Care (Signed)
Safety rounds maintained on units.  Support and encouragement offered.

## 2017-06-05 NOTE — Progress Notes (Signed)
Patient is active in the unit,  participate and socialize well with peers, patient received medicines for headache and anxiety, sleep long hours , contract for safety, and 15 minute checks is in place , no distress noted.

## 2017-06-05 NOTE — Progress Notes (Signed)
D- Patient alert and oriented. Patient presents in an anxious mood on assessment. Patient rates her anxiety level at a "3/10 from being around all those people in the day room". Patient states that she is ready for discharge "I'm feeling much better". Patient reports that her pain level is a "1/10, but I can feel my headache coming on" and patient requested her medication for headaches from this writer. Patient denies SI, HI, AVH, at this time  A- Scheduled medications administered to patient, per MD orders. Support and encouragement provided.  Routine safety checks conducted every 15 minutes.  Patient informed to notify staff with problems or concerns.  R- No adverse drug reactions noted. Patient contracts for safety at this time. Patient compliant with medications and treatment plan. Patient receptive, calm, and cooperative. Patient interacts well with others on the unit.  Patient remains safe at this time.

## 2017-06-05 NOTE — Progress Notes (Signed)
[facility]  06/05/2017 1:29 PM Megan Mejia  MRN:  [id number] Subjective: Continues to endorse that there is an organization out to get her.  Believes that there are patients on the unit were being controlled by people she knows on the outside and her dangerous to her safety.  Tells me that she spoke to her daughter, her nephew and her sister.  Denies any suicidal thoughts currently.  Endorses that the medications are doing okay.  Principal Problem: Severe recurrent major depressive disorder with psychotic features (HCC) Diagnosis:   Patient Active Problem List   Diagnosis Date Noted  . Severe recurrent major depressive disorder with psychotic features (HCC) [F33.3] 05/30/2017  . Anxiety state [F41.1] 05/30/2017   Total Time spent with patient: 30 minutes  Past Psychiatric History: See H7P  Past Medical History:  Past Medical History:  Diagnosis Date  . Anxiety   . Depression   . High cholesterol   . Hypertension     Past Surgical History:  Procedure Laterality Date  . TONSILLECTOMY     Family History: History reviewed. No pertinent family history. Family Psychiatric  History: See H&P Social History:  Social History   Substance and Sexual Activity  Alcohol Use Yes   Comment: every day     Social History   Substance and Sexual Activity  Drug Use Yes  . Types: Marijuana    Social History   Socioeconomic History  . Marital status: Divorced    Spouse name: None  . Number of children: None  . Years of education: None  . Highest education level: None  Social Needs  . Financial resource strain: None  . Food insecurity - worry: None  . Food insecurity - inability: None  . Transportation needs - medical: None  . Transportation needs - non-medical: None  Occupational History  . None  Tobacco Use  . Smoking status: Current Every Day Smoker    Packs/day: 1.00    Types: Cigarettes  . Smokeless tobacco: Never Used  Substance and Sexual Activity  .  Alcohol use: Yes    Comment: every day  . Drug use: Yes    Types: Marijuana  . Sexual activity: Not Currently  Other Topics Concern  . None  Social History Narrative  . None   Additional Social History:    History of alcohol / drug use?: Yes                    Sleep: Good  Appetite:  Good  Current Medications: Current Facility-Administered Medications  Medication Dose Route Frequency Provider Last Rate Last Dose  . acetaminophen (TYLENOL) tablet 650 mg  650 mg Oral Q6H PRN [name], [name], MD   650 mg at 06/04/17 [id number]  . alum & mag hydroxide-simeth (MAALOX/MYLANTA) 200-200-20 MG/5ML suspension 30 mL  30 mL Oral Q4H PRN [name], [name], MD      . butalbital-acetaminophen-caffeine (FIORICET, ESGIC) [phone] MG per tablet 1 tablet  1 tablet Oral Q6H PRN Aundria Rudakesh, Shatisha Falter, MD   1 tablet at 06/05/17 [id number]  . hydrOXYzine (ATARAX/VISTARIL) tablet 25 mg  25 mg Oral TID PRN [name], [name], MD   25 mg at 06/05/17 [id number]  . magnesium hydroxide (MILK OF MAGNESIA) suspension 30 mL  30 mL Oral Daily PRN [name], [name], MD      . nicotine (NICODERM CQ - dosed in mg/24 hours) patch 21 mg  21 mg Transdermal Daily [name], [name], MD   21 mg  at 06/05/17 [id number]  . risperiDONE (RISPERDAL) tablet 2 mg  2 mg Oral BID Aundria Rudakesh, Brianna Esson, MD   2 mg at 06/05/17 [id number]  . venlafaxine (EFFEXOR) tablet 75 mg  75 mg Oral BID WC McNew, Ileene HutchinsonHolly R, MD   75 mg at 06/05/17 [id number]    Lab Results:  No results found for this or any previous visit (from the past 48 hour(s)).  Blood Alcohol level:  Lab Results  Component Value Date   ETH <10 05/27/2017    Metabolic Disorder Labs: Lab Results  Component Value Date   HGBA1C 5.6 05/30/2017   MPG 114.02 05/30/2017   No results found for: PROLACTIN Lab Results  Component Value Date   CHOL 227 (H) 05/30/2017   TRIG 256 (H) 05/30/2017   HDL 33 (L) 05/30/2017   CHOLHDL 6.9 05/30/2017   VLDL 51 (H) 05/30/2017   LDLCALC 143 (H) 05/30/2017   LDLCALC 112 (H)  12/27/2011    Physical Findings: AIMS: Facial and Oral Movements Muscles of Facial Expression: None, normal Lips and Perioral Area: None, normal Jaw: None, normal Tongue: None, normal,Extremity Movements Upper (arms, wrists, hands, fingers): None, normal Lower (legs, knees, ankles, toes): None, normal, Trunk Movements Neck, shoulders, hips: None, normal, Overall Severity Severity of abnormal movements (highest score from questions above): None, normal Incapacitation due to abnormal movements: None, normal Patient's awareness of abnormal movements (rate only patient's report): No Awareness, Dental Status Current problems with teeth and/or dentures?: Yes Does patient usually wear dentures?: Yes  CIWA:  CIWA-Ar Total: 0 COWS:  COWS Total Score: 1  Musculoskeletal: Strength & Muscle Tone: within normal limits Gait & Station: normal Patient leans: N/A  Psychiatric Specialty Exam: Physical Exam  Nursing note and vitals reviewed.   Review of Systems  All other systems reviewed and are negative.   Blood pressure 117/82, pulse 95, temperature 98.4 F (36.9 C), temperature source Oral, resp. rate 16, height 5' 1.42" (1.56 m), weight 150 lb (68 kg), last menstrual period 05/21/2017, SpO2 99 %.Body mass index is 27.96 kg/m.  General Appearance: Casual  Eye Contact:  Good  Speech:  Clear and Coherent  Volume:  Normal  Mood: Concerned about safety  Affect:  Congruent  Thought Process: Disorganized, psychotic  Orientation:  Full (Time, Place, and Person)  Thought Content:  Negative  Suicidal Thoughts:  No  Homicidal Thoughts:  No  Memory:  Immediate;   Fair  Judgement:  Fair  Insight:  Fair  Psychomotor Activity:  Normal  Concentration:  Concentration: Fair  Recall:  FiservFair  Fund of Knowledge:  Fair  Language:  Fair  Akathisia:  No      Assets:  Communication Skills Desire for Improvement Resilience  ADL's:  Intact  Cognition:  WNL  Sleep:  Number of Hours: 7      Treatment Plan Summary: 41 yo female with a history of psychosis, presented to the ER after being grossly psychotic.  Compliance with medications has been doubtful in the past.  Although possibly scheduled for discharge, patient today reveals to me dilution of reference and persecution that seems to be systematized.  Is concerned about her safety secondary to the delusion.  Collateral from daughter also indicates that patient is not safe to return home. Patient continues to have this elaborate delusional belief which involves multiple layers and is well systematized.  The delusional belief involves being persecuted by the mafia, mistaken identities and concern about her safety.  Plan:  Psychosis -Increase Risperdal to 2 mg twice  daily from 06/04/2017..    Mood Continue Effexor 75 mg twice daily -d/c Topamax due to potential to cause cognitive dulling  Anxiety -d/c scheduled vistaril -prn vistaril  Headache - Fioricet tablet  (caffeine, acetaminophen, butalbital) combination 1 tablet every 6 hours as needed  Dispo -Daughter will come get patient when she is safe for discharge.  TSH and A1c are unremarkable.  Lipid panel elevated.  Aundria Rud, MD 06/05/2017, 1:29 PM

## 2017-06-05 NOTE — [facility] Group Notes (Signed)
[facility] Group Notes:  (Nursing/MHT/Case Management/Adjunct)  Date:  06/05/2017  Time:  7:21 AM  Type of Therapy:  Psychoeducational Skills  Participation Level:  Active  Participation Quality:  Appropriate and Attentive  Affect:  Appropriate  Cognitive:  Appropriate  Insight:  Appropriate  Engagement in Group:  Engaged  Modes of Intervention:  Discussion, Socialization and Support  Summary of Progress/Problems:  Chancy MilroyLaquanda Y Ceyda Peterka 06/05/2017, 7:21 AM

## 2017-06-05 NOTE — Plan of Care (Signed)
Patient verbalizes understanding of the information provided to her thus far without any further questions or concerns. Patient states that she is feeling better and is ready for discharge. Patient has the ability to verbalize any frustrations or anger appropriately. Patient verbalizes understanding of her therapeutic regimen and has been in compliance with her therapeutic/medication regimen. Patient has been out in the milieu interacting well with others on the unit. Patient denies SI/HI/AVH at this time. Patient has remained free from injury thus far and is safe on the unit.

## 2017-06-06 NOTE — Plan of Care (Signed)
  Safety: Ability to remain free from injury will improve 06/06/2017 2041 - Progressing by Delos HaringPhillips, Amberli Ruegg A, RN Note Pt has been safe on the unit this evening

## 2017-06-06 NOTE — Progress Notes (Signed)
D: Pt denies SI/HI/AVH. Pt is pleasant and cooperative. Pt stated she was feeling better" I'm back on my meds"   A: Pt was offered support and encouragement. Pt was given scheduled medications. Pt was encourage to attend groups. Q 15 minute checks were done for safety.   R:Pt attends groups and interacts well with peers and staff. Pt is taking medication. Pt has no complaints.Pt receptive to treatment and safety maintained on unit.

## 2017-06-06 NOTE — Progress Notes (Addendum)
Recreation Therapy Notes  Date: 12.26.2018  Time: 9:30 am   Location: Craft Room  Behavioral response: Appropriate  Intervention Topic: Team Work  Discussion/Intervention: Group content on today was focused on teamwork. The group identified what teamwork is. Individuals described who is a part of their team. Patients expressed why they thought teamwork is important. The group stated reasons why they thought it was easier to work with a Comptrollersmaller/larger team. Individuals discussed some positives and negatives of working with a team. Patients gave examples of past experiences they had while working with a team. The group participated in the intervention "What is That", where patients were given a chance to point out qualities they look for in a team mate and were able to work in teams with each other. Clinical Observations/Feedback:  Patient came to group and defined teamwork as working together for a goal. She identified her family and friends as being a part of her team. Individual described that when she is working in a team she would like to be working in a 50 /50 partnership and for the other person to be reliable. Patient expressed that she prefers to work in a bigger team when working on something. She participated in the intervention and was social with staff during group.  Latiqua Daloia LRT/CTRS         Lenette Rau 06/06/2017 10:12 AM

## 2017-06-06 NOTE — Progress Notes (Signed)
Patient is pleasant and cooperative in the unit.Affect is brighter.Appropriate with staff & peers.Denies SI,HI and AVH.Compliant with medications.Verbalized her  concern  to spend some time with her son.Attended groups.Appetite and energy level good.Support and encouragement given.

## 2017-06-06 NOTE — [facility] Group Notes (Signed)
  06/06/2017  Time: 1:00PM  Type of Therapy/Topic:  Group Therapy:  Emotion Regulation  Participation Level:  Minimal   Description of Group:    The purpose of this group is to assist patients in learning to regulate negative emotions and experience positive emotions. Patients will be guided to discuss ways in which they have been vulnerable to their negative emotions. These vulnerabilities will be juxtaposed with experiences of positive emotions or situations, and patients will be challenged to use positive emotions to combat negative ones. Special emphasis will be placed on coping with negative emotions in conflict situations, and patients will process healthy conflict resolution skills.  Therapeutic Goals: 1. Patient will identify two positive emotions or experiences to reflect on in order to balance out negative emotions 2. Patient will label two or more emotions that they find the most difficult to experience 3. Patient will demonstrate positive conflict resolution skills through discussion and/or role plays  Summary of Patient Progress: Pt continues to work towards their tx goals but has not yet reached them. Pt was able to appropriately participate in group discussion, and was able to offer support/validation to other group members. Pt reported that she is "feeling tired and not feeling well today." Pt reported two things she can do to stay safe/calm in the moment, and to balance out negative emotions are "reading and watching videos of preachers." Pt reported that she often has difficulty feeling empathy for others, "I don't know why."    Therapeutic Modalities:   Cognitive Behavioral Therapy Feelings Identification Dialectical Behavioral Therapy   Heidi DachKelsey  Coye, MSW, LCSW 06/06/2017 1:52 PM

## 2017-06-06 NOTE — Progress Notes (Signed)
Patient is calm and cooperating with medical regimen and ADLs,  Patient states feeling good about self and her progress and treatment she getting. Patient is socializing well with peers and staff, participating in group activities and assertive. Contract for safety of and others, 15 minute check is maintained no distress noted.
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
## 2017-06-06 NOTE — Progress Notes (Signed)
Recreation Therapy Notes   Date: 12.26 .2018  Time: 3:00pm  Location: Craft room  Behavioral response: Appropriate  Group Type: Craft  Participation level: Active  Communication: Patient was social with peers and staff.  Comments: N/A  Kareen Hitsman LRT/CTRS        Megan Mejia 06/06/2017 4:27 PM

## 2017-06-06 NOTE — [facility] Group Notes (Signed)
[facility] Group Notes:  (Nursing/MHT/Case Management/Adjunct)  Date:  06/06/2017  Time:  9:48 PM  Type of Therapy:  Group Therapy  Participation Level:  Active  Participation Quality:  Appropriate  Affect:  Appropriate  Cognitive:  Appropriate  Insight:  Appropriate  Engagement in Group:  Engaged  Modes of Intervention:  Discussion  Summary of Progress/Problems:  Burt EkJanice Marie Billyjoe Go 06/06/2017, 9:48 PM

## 2017-06-06 NOTE — Plan of Care (Signed)
Patient is progressing in all areas. 

## 2017-06-06 NOTE — [facility] Group Notes (Signed)
[facility] Group Notes:  (Nursing/MHT/Case Management/Adjunct)  Date:  06/06/2017  Time:  7:09 AM  Type of Therapy:  Psychoeducational Skills  Participation Level:  Active  Participation Quality:  Appropriate and Attentive  Affect:  Appropriate  Cognitive:  Appropriate  Insight:  Appropriate  Engagement in Group:  Engaged  Modes of Intervention:  Discussion, Socialization and Support  Summary of Progress/Problems:  Chancy MilroyLaquanda Y Alontae Chaloux 06/06/2017, 7:09 AM

## 2017-06-07 MED ORDER — HYDROXYZINE HCL 25 MG PO TABS: 25.0000 mg | ORAL_TABLET | Freq: Three times a day (TID) | ORAL | 1 refills | Status: DC | PRN

## 2017-06-07 MED ORDER — BUTALBITAL-APAP-CAFFEINE 50-325-40 MG PO TABS: 1.0000 | ORAL_TABLET | Freq: Four times a day (QID) | ORAL | 0 refills | Status: DC | PRN

## 2017-06-07 MED ORDER — RISPERIDONE 2 MG PO TABS: 2.0000 mg | ORAL_TABLET | Freq: Two times a day (BID) | ORAL | 1 refills | Status: DC

## 2017-06-07 MED ORDER — VENLAFAXINE HCL 75 MG PO TABS: 75.0000 mg | ORAL_TABLET | Freq: Two times a day (BID) | ORAL | 1 refills | Status: DC

## 2017-06-07 NOTE — Progress Notes (Signed)
D: Pt denies SI/HI/AVH. Pt is pleasant and cooperative. Pt seen in the dayroom interacting with peers. Pt only complaint was she stated she felt like she was getting a HA , so she wanted a Fioricet with her night med.   A: Pt was offered support and encouragement. Pt was given scheduled medications. Pt was encourage to attend groups. Q 15 minute checks were done for safety.   R:Pt attends groups and interacts well with peers and staff. Pt is taking medication. Pt has no complaints.Pt receptive to treatment and safety maintained on unit.

## 2017-06-07 NOTE — Progress Notes (Signed)
[facility]  06/07/2017 12:20 PM Loma MessingKimberly K Favela  MRN:  [id number]   Subjective:  Pt states that she is feeling much better. She states that her mood has improved since being on medications. When asked what has changed, she states, "When I came in I was thinking crazy things and I was so paranoid." She states that she no longer feels paranoid and does not feel like the Dorcas Carrowmafia is out to get her. She has been out o her room and around others in the milieu which is a great improvement. She is very organized and goal directed in conversation. She states that her 43 yo son is in town for the week and wants to spend time with him. She denies SI or any thoughts of self harm. Denies AH, VH, HI. Affect appears brighter. She states, "I know I need to stay on my medications when I leave." She also voices that she plans to meet with Lorella NimrodHarvey with [facility] for follow up and plans to do this.   Principal Problem: Severe recurrent major depressive disorder with psychotic features (HCC) Diagnosis:   Patient Active Problem List   Diagnosis Date Noted  . Severe recurrent major depressive disorder with psychotic features (HCC) [F33.3] 05/30/2017  . Anxiety state [F41.1] 05/30/2017   Total Time spent with patient: 20 minutes  Past Psychiatric History: See H&P  Past Medical History:  Past Medical History:  Diagnosis Date  . Anxiety   . Depression   . High cholesterol   . Hypertension     Past Surgical History:  Procedure Laterality Date  . TONSILLECTOMY     Family History: History reviewed. No pertinent family history. Family Psychiatric  History: See H&P Social History:  Social History   Substance and Sexual Activity  Alcohol Use Yes   Comment: every day     Social History   Substance and Sexual Activity  Drug Use Yes  . Types: Marijuana    Social History   Socioeconomic History  . Marital status: Divorced    Spouse name: None  . Number of children: None  . Years of education: None   . Highest education level: None  Social Needs  . Financial resource strain: None  . Food insecurity - worry: None  . Food insecurity - inability: None  . Transportation needs - medical: None  . Transportation needs - non-medical: None  Occupational History  . None  Tobacco Use  . Smoking status: Current Every Day Smoker    Packs/day: 1.00    Types: Cigarettes  . Smokeless tobacco: Never Used  Substance and Sexual Activity  . Alcohol use: Yes    Comment: every day  . Drug use: Yes    Types: Marijuana  . Sexual activity: Not Currently  Other Topics Concern  . None  Social History Narrative  . None   Additional Social History:    History of alcohol / drug use?: Yes                    Sleep: Good  Appetite:  Good  Current Medications: Current Facility-Administered Medications  Medication Dose Route Frequency Provider Last Rate Last Dose  . acetaminophen (TYLENOL) tablet 650 mg  650 mg Oral Q6H PRN [name], [name], MD   650 mg at 06/04/17 [id number]  . alum & mag hydroxide-simeth (MAALOX/MYLANTA) 200-200-20 MG/5ML suspension 30 mL  30 mL Oral Q4H PRN [name], [name], MD      . butalbital-acetaminophen-caffeine (FIORICET, ESGIC)  50-325-40 MG per tablet 1 tablet  1 tablet Oral Q6H PRN Aundria Rudakesh, Gopalkumar, MD   1 tablet at 06/07/17 0816  . hydrOXYzine (ATARAX/VISTARIL) tablet 25 mg  25 mg Oral TID PRN [name], [name], MD   25 mg at 06/07/17 0816  . magnesium hydroxide (MILK OF MAGNESIA) suspension 30 mL  30 mL Oral Daily PRN [name], [name], MD      . nicotine (NICODERM CQ - dosed in mg/24 hours) patch 21 mg  21 mg Transdermal Daily [name], [name], MD   21 mg at 06/07/17 0816  . risperiDONE (RISPERDAL) tablet 2 mg  2 mg Oral BID Aundria Rudakesh, Gopalkumar, MD   2 mg at 06/07/17 0814  . venlafaxine (EFFEXOR) tablet 75 mg  75 mg Oral BID WC Khrystina Bonnes, Ileene HutchinsonHolly R, MD   75 mg at 06/07/17 [id number]    Lab Results: No results found for this or any previous visit (from the past 48 hour(s)).  Blood  Alcohol level:  Lab Results  Component Value Date   ETH <10 05/27/2017    Metabolic Disorder Labs: Lab Results  Component Value Date   HGBA1C 5.6 05/30/2017   MPG 114.02 05/30/2017   No results found for: PROLACTIN Lab Results  Component Value Date   CHOL 227 (H) 05/30/2017   TRIG 256 (H) 05/30/2017   HDL 33 (L) 05/30/2017   CHOLHDL 6.9 05/30/2017   VLDL 51 (H) 05/30/2017   LDLCALC 143 (H) 05/30/2017   LDLCALC 112 (H) 12/27/2011    Physical Findings: AIMS: Facial and Oral Movements Muscles of Facial Expression: None, normal Lips and Perioral Area: None, normal Jaw: None, normal Tongue: None, normal,Extremity Movements Upper (arms, wrists, hands, fingers): None, normal Lower (legs, knees, ankles, toes): None, normal, Trunk Movements Neck, shoulders, hips: None, normal, Overall Severity Severity of abnormal movements (highest score from questions above): None, normal Incapacitation due to abnormal movements: None, normal Patient's awareness of abnormal movements (rate only patient's report): No Awareness, Dental Status Current problems with teeth and/or dentures?: Yes Does patient usually wear dentures?: Yes  CIWA:  CIWA-Ar Total: 0 COWS:  COWS Total Score: 1  Musculoskeletal: Strength & Muscle Tone: within normal limits Gait & Station: normal Patient leans: N/A  Psychiatric Specialty Exam: Physical Exam  Nursing note and vitals reviewed.   Review of Systems  All other systems reviewed and are negative.   Blood pressure 105/76, pulse 89, temperature 98.3 F (36.8 C), temperature source Oral, resp. rate 18, height 5' 1.42" (1.56 m), weight 68 kg (150 lb), last menstrual period 05/21/2017, SpO2 100 %.Body mass index is 27.96 kg/m.  General Appearance: Casual  Eye Contact:  Good  Speech:  Clear and Coherent  Volume:  Normal  Mood:  Euthymic  Affect:  Congruent  Thought Process:  Coherent and Goal Directed  Orientation:  Full (Time, Place, and Person)   Thought Content:  Negative  Suicidal Thoughts:  No  Homicidal Thoughts:  No  Memory:  Immediate;   Fair  Judgement:  Fair  Insight:  Fair  Psychomotor Activity:  Normal  Concentration:  Concentration: Fair  Recall:  [employer]  Fund of Knowledge:  Fair  Language:  Fair  Akathisia:  No      Assets:  Communication Skills Desire for Improvement Housing Resilience Social Support  ADL's:  Intact  Cognition:  WNL  Sleep:  Number of Hours: 7     Treatment Plan Summary: 41 yo female admitted due to confusion and psychosis. Pt continues to show significant  improvements and no longer paranoid or expressing delusional thought content. She has insight into the fact that these things were not true. She has been out on the unit and socializing with peers.   Plan:  Psychosis -Continue Risperdal 2 mg BID  Mood -Continue Effexor 75 mg BID  Anxiety -prn Vistaril  Headache -Fioricet  Dispo -CSW did speak with her niece today who will pick her up tomorrow at discharge. Pt will follow up with [facility] Haskell Riling, MD 06/07/2017, 12:20 PM

## 2017-06-07 NOTE — Progress Notes (Signed)
Recreation Therapy Notes  Date: 12.27.2018  Time: 9:30 am  Location: Craft Room  Behavioral response: N/A  Intervention Topic: Problem Solving  Discussion/Intervention: Patient did not attend group. Clinical Observations/Feedback:  Patient did not attend group. Kash Davie LRT/CTRS         Maximum Reiland 06/07/2017 11:12 AM 

## 2017-06-07 NOTE — [facility] Group Notes (Signed)
LCSW Group Therapy Note 06/07/2017 9:00 AM  Type of Therapy and Topic:  Group Therapy:  Setting Goals  Participation Level:  Active  Description of Group: In this process group, patients discussed using strengths to work toward goals and address challenges.  Patients identified two positive things about themselves and one goal they were working on.  Patients were given the opportunity to share openly and support each other's plan for self-empowerment.  The group discussed the value of gratitude and were encouraged to have a daily reflection of positive characteristics or circumstances.  Patients were encouraged to identify a plan to utilize their strengths to work on current challenges and goals.  Therapeutic Goals 1. Patient will verbalize personal strengths/positive qualities and relate how these can assist with achieving desired personal goals 2. Patients will verbalize affirmation of peers plans for personal change and goal setting 3. Patients will explore the value of gratitude and positive focus as related to successful achievement of goals 4. Patients will verbalize a plan for regular reinforcement of personal positive qualities and circumstances.  Summary of Patient Progress:  Megan Mejia was able to actively participate in today's goal setting group.  Megan Mejia appears to have a good understanding of how she can set her own SMART goals.  Megan Mejia stated her goal for today is "to talk with her doctor about discharging today so she can go home and spend some time with her son who is in town this week".     Therapeutic Modalities Cognitive Behavioral Therapy Motivational Interviewing    Alease FrameSonya S Breyana Follansbee, [place]CS[place] 06/07/2017 10:23 AM

## 2017-06-07 NOTE — [facility] Group Notes (Signed)
[facility] Group Notes:  (Nursing/MHT/Case Management/Adjunct)  Date:  06/07/2017  Time:  9:39 PM  Type of Therapy:  Group Therapy  Participation Level:  Active  Participation Quality:  Appropriate  Affect:  Appropriate  Cognitive:  Appropriate  Insight:  Good  Engagement in Group:  Engaged  Modes of Intervention:  Support  Summary of Progress/Problems:  Megan Mejia 06/07/2017, 9:39 PM

## 2017-06-07 NOTE — Progress Notes (Signed)
D- Patient alert and oriented. Patient presents in a pleasant, but anxious mood on assessment because of her anticipating discharge to "go home and see my son". Patient denies any signs/symptoms of depression, but states that "my anxiety level will go up if I don't take anything for it". Patient reports that her pain level is a "3/10" because of her headaches and requests her PRN medication from this Clinical research associatewriter. Patient denies SI, HI, AVH, at this time. Patient's goal for today is to "go home to see my son" and will meet her goal by "talk to my doctor about discharge".  A- Scheduled medications administered to patient, per MD orders. Support and encouragement provided.  Routine safety checks conducted every 15 minutes.  Patient informed to notify staff with problems or concerns.  R- No adverse drug reactions noted. Patient contracts for safety at this time. Patient compliant with medications and treatment plan. Patient receptive, calm, and cooperative. Patient interacts well with others on the unit.  Patient remains safe at this time.

## 2017-06-07 NOTE — [facility] Group Notes (Signed)
06/07/2017  Time: 1:00PM   Type of Therapy/Topic:  Group Therapy:  Balance in Life  Participation Level:  Active  Description of Group:   This group will address the concept of balance and how it feels and looks when one is unbalanced. Patients will be encouraged to process areas in their lives that are out of balance and identify reasons for remaining unbalanced. Facilitators will guide patients in utilizing problem-solving interventions to address and correct the stressor making their life unbalanced. Understanding and applying boundaries will be explored and addressed for obtaining and maintaining a balanced life. Patients will be encouraged to explore ways to assertively make their unbalanced needs known to significant others in their lives, using other group members and facilitator for support and feedback.  Therapeutic Goals: 1. Patient will identify two or more emotions or situations they have that consume much of in their lives. 2. Patient will identify signs/triggers that life has become out of balance:  3. Patient will identify two ways to set boundaries in order to achieve balance in their lives:  4. Patient will demonstrate ability to communicate their needs through discussion and/or role plays  Summary of Patient Progress: Pt continues to work towards their tx goals but has not yet reached them. Pt was able to appropriately participate in group discussion, and was able to offer support/validation to other group members. Pt reported she feels that friendships and emotional health take up too much of her time. Pt reported she would like to devote more attention to maintaining her relationships with her children. Pt reported one way she can improve her balance is life is, "saying no to people."    Therapeutic Modalities:   Cognitive Behavioral Therapy Solution-Focused Therapy Assertiveness Training   Heidi DachKelsey Kareem Aul, MSW, LCSW 06/07/2017 1:47 PM

## 2017-06-08 DIAGNOSIS — F23 Brief psychotic disorder: Secondary | ICD-10-CM

## 2017-06-08 NOTE — Progress Notes (Signed)
Recreation Therapy Notes  Date: 12.28.2018  Time: 9:30 am  Location: Craft Room  Behavioral response: N/A  Intervention Topic: Self-Care  Discussion/Intervention: Patient did not attend group. Clinical Observations/Feedback:  Patient did not attend group.   Shakala Marlatt LRT/CTRS         Suann Klier 06/08/2017 11:35 AM

## 2017-06-08 NOTE — Discharge Summary (Addendum)
Physician Discharge Summary Note  Patient:  Megan MessingKimberly K Krysiak is an 41 y.o., female MRN:  [id number] DOB:  20-Apr-1976 Patient phone:  [phone] (home)  Patient address:   [address] [place] [address],  Total Time spent with patient: 15 minutes  Plus 20 minutes of medication reconciliation, discharge planning, and discharge documentation   Date of Admission:  05/29/2017 Date of Discharge: 06/08/17  Reason for Admission:  Paranoia, confusion  Principal Problem: Severe recurrent major depressive disorder with psychotic features [facility](HCC) Discharge Diagnoses:  Patient Active Problem List   Diagnosis Date Noted  . 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 Severe recurrent major depressive disorder with psychotic features (HCC) [F33.3] 05/30/2017    Priority: High  . Anxiety state [F41.1] 05/30/2017    Past Psychiatric History: See H&P  Past Medical History:  Past Medical History:  Diagnosis Date  . Anxiety   . Depression   . High cholesterol   . Hypertension     Past Surgical History:  Procedure Laterality Date  . TONSILLECTOMY     Family History: History reviewed. No pertinent family history. Family Psychiatric  History: See H&P Social History:  Social History   Substance and Sexual Activity  Alcohol Use Yes   Comment: every day     Social History   Substance and Sexual Activity  Drug Use Yes  . Types: Marijuana    Social History   Socioeconomic History  . Marital status: Divorced    Spouse name: None  . Number of children: None  . Years of education: None  . Highest education level: None  Social Needs  . Financial resource strain: None  . Food insecurity - worry: None  . Food insecurity - inability: None  . Transportation needs - medical: None  . Transportation needs - non-medical: None  Occupational History  . None  Tobacco Use  . Smoking status: Current Every Day Smoker    Packs/day: 1.00    Types: Cigarettes   . Smokeless tobacco: Never Used  Substance and Sexual Activity  . Alcohol use: Yes    Comment: every day  . Drug use: Yes    Types: Marijuana  . Sexual activity: Not Currently  Other Topics Concern  . None  Social History Narrative  . None    Hospital Course:  Pt was restarted on Effexor as she had stopped this medication abruptly. She preferred IR version. This was titrated up to 75 mg BID. She was also started on Risperdal and titrated to 2 mg BID as she was displaying significant paranoia. On day of discharge, paranoia and psychosis improved significantly. She was much more organized and able to have a rational conversation. She was out in the milieu much more and interacting with peers. She was attending groups and was very appropriate. She denied SI throughout the hospitalization including on day of discharge. She was looking forward to spending time with her son this weekend. She was sleeping well and eating well. She felt her mood was much improved since being back on medications. She voices that she plans to meet Lorella NimrodHarvey with [facility] for follow up. She also states that she "knows I need to stay on my medications." She was given 7 day supply of medications from our pharmacy.  The patient is at low risk of imminent suicide. Patient denied thoughts, intent, or plan for harm to self or others, expressed significant future orientation, and expressed an ability to mobilize assistance for  her needs. She is presently void of any contributing psychiatric symptoms, cognitive difficulties, or substance use which would elevate her risk for lethality. Chronic risk for lethality is elevated in light of history of medication noncompliance The chronic risk is presently mitigated by her ongoing desire and engagement [facility] and mobilization of support from family and friends. Chronic risk may elevate if she experiences any significant loss or worsening of symptoms, which can be managed and monitored  through outpatient providers. At this time,a cute risk for lethality is low and she is stable for ongoing outpatient management.   Modifiable risk factors were addressed during this hospitalization through appropriate pharmacotherapy and establishment of outpatient follow-up treatment. Some risk factors for suicide are situational (i.e. Unstable housing) or related personality pathology (i.e. Poor coping mechanisms) and thus cannot be further mitigated by continued hospitalization in this setting.    Physical Findings: AIMS: Facial and Oral Movements Muscles of Facial Expression: None, normal Lips and Perioral Area: None, normal Jaw: None, normal Tongue: None, normal,Extremity Movements Upper (arms, wrists, hands, fingers): None, normal Lower (legs, knees, ankles, toes): None, normal, Trunk Movements Neck, shoulders, hips: None, normal, Overall Severity Severity of abnormal movements (highest score from questions above): None, normal Incapacitation due to abnormal movements: None, normal Patient's awareness of abnormal movements (rate only patient's report): No Awareness, Dental Status Current problems with teeth and/or dentures?: Yes Does patient usually wear dentures?: Yes  CIWA:  CIWA-Ar Total: 0 COWS:  COWS Total Score: 1  Musculoskeletal: Strength & Muscle Tone: within normal limits Gait & Station: normal Patient leans: N/A  Psychiatric Specialty Exam: Physical Exam  Nursing note and vitals reviewed.   Review of Systems  All other systems reviewed and are negative.   Blood pressure 116/83, pulse 93, temperature 98.6 F (37 C), resp. rate 18, height 5' 1.42" (1.56 m), weight 68 kg (150 lb), last menstrual period 05/21/2017, SpO2 100 %.Body mass index is 27.96 kg/m.  General Appearance: Casual  Eye Contact:  Good  Speech:  Clear and Coherent  Volume:  Normal  Mood:  Euthymic  Affect:  Congruent  Thought Process:  Coherent and Goal Directed  Orientation:  Full (Time,  Place, and Person)  Thought Content:  Negative  Suicidal Thoughts:  No  Homicidal Thoughts:  No  Memory:  Immediate;   Fair  Judgement:  Fair  Insight:  Fair  Psychomotor Activity:  Normal  Concentration:  Concentration: Fair  Recall:  Fair  Fund of Knowledge:  Fair  Language:  Fair  Akathisia:  No      Assets:  Communication Skills Desire for Improvement Housing Social Support  ADL's:  Intact  Cognition:  WNL  Sleep:  Number of Hours: 8        Has this patient used any form of tobacco in the last 30 days? (Cigarettes, Smokeless Tobacco, Cigars, and/or Pipes) Yes, Yes, A prescription for an FDA-approved tobacco cessation medication was offered at discharge and the patient refused  Blood Alcohol level:  Lab Results  Component Value Date   ETH <10 05/27/2017    Metabolic Disorder Labs:  Lab Results  Component Value Date   HGBA1C 5.6 05/30/2017   MPG 114.02 05/30/2017   No results found for: PROLACTIN Lab Results  Component Value Date   CHOL 227 (H) 05/30/2017   TRIG 256 (H) 05/30/2017   HDL 33 (L) 05/30/2017   CHOLHDL 6.9 05/30/2017   VLDL 51 (H) 05/30/2017   LDLCALC 143 (H) 05/30/2017  LDLCALC 112 (H) 12/27/2011    See Psychiatric Specialty Exam and Suicide Risk Assessment completed by Attending Physician prior to discharge.  Discharge destination:  Home  Is patient on multiple antipsychotic therapies at discharge:  No   Has Patient had three or more failed trials of antipsychotic monotherapy by history:  No  Recommended Plan for Multiple Antipsychotic Therapies: NA  Discharge Instructions    Increase activity slowly   Complete by:  As directed      Allergies as of 06/08/2017   No Known Allergies     Medication List    STOP taking these medications   EFFEXOR XR PO Replaced by:  venlafaxine 75 MG tablet   TOPAMAX PO     TAKE these medications     Indication  butalbital-acetaminophen-caffeine 50-325-40 MG tablet Commonly known as:   FIORICET, ESGIC Take 1 tablet by mouth every 6 (six) hours as needed for headache.  Indication:  Migraine Headache   hydrOXYzine 25 MG tablet Commonly known as:  ATARAX/VISTARIL Take 1 tablet (25 mg total) by mouth 3 (three) times daily as needed for anxiety. What changed:    medication strength  how much to take  when to take this  reasons to take this  Indication:  Feeling Anxious   risperiDONE 2 MG tablet Commonly known as:  RISPERDAL Take 1 tablet (2 mg total) by mouth 2 (two) times daily.  Indication:  Psychosis   venlafaxine 75 MG tablet Commonly known as:  EFFEXOR Take 1 tablet (75 mg total) by mouth 2 (two) times daily with a meal. Replaces:  EFFEXOR XR PO  Indication:  Major Depressive Disorder      Follow-up Information    [facility] Go on 06/11/2017.   Why:  Unk Pinto will come and pick you up on 06/11/2017 at 7am for peer support services. Unk Pinto [phone] Contact information: [address] [place] [address] [phone]           Follow-up recommendations: Follow up with [facility]  Signed: Haskell Riling, MD 06/08/2017, 9:28 AM

## 2017-06-08 NOTE — Progress Notes (Signed)
Recreation Therapy Notes  INPATIENT RECREATION TR PLAN  Patient Details Name: Megan Mejia MRN: [id number] DOB: 07/23/1975 Today's Date: 06/08/2017  Rec Therapy Plan Is patient appropriate for Therapeutic Recreation?: Yes Treatment times per week: at least 3 Estimated Length of Stay: 5-7 days TR Treatment/Interventions: Group participation (Comment)  Discharge Criteria Pt will be discharged from therapy if:: Discharged Treatment plan/goals/alternatives discussed and agreed upon by:: Patient/family  Discharge Summary Short term goals set: Patient will identify 3 triggers for substance use x5 days.  Short term goals met: Adequate for discharge Reason goals not met: N/A Therapeutic equipment acquired: N/A Reason patient discharged from therapy: Discharge from hospital Pt/family agrees with progress & goals achieved: Yes Date patient discharged from therapy: 06/08/17   Alinna Siple 06/08/2017, 2:11 PM

## 2017-06-08 NOTE — [facility] Suicide Risk Assessment (Signed)
[facility]   Principal Problem: Severe recurrent major depressive disorder with psychotic features [facility](HCC) Discharge Diagnoses:  Patient Active Problem List   Diagnosis Date Noted  . Severe recurrent major depressive disorder with psychotic features (HCC) [F33.3] 05/30/2017  . Anxiety state [F41.1] 05/30/2017    Total Time spent with patient: 15 minutes  Mental Status Per Nursing Assessment::   On Admission:  NA  Demographic Factors:  Caucasian and Unemployed  Loss Factors: NA  Historical Factors: Prior suicide attempts and Impulsivity  Risk Reduction Factors:   Positive social support, Positive therapeutic relationship and Positive coping skills or problem solving skills  Continued Clinical Symptoms:  Anxiety  Cognitive Features That Contribute To Risk:  None    Suicide Risk:  Minimal: No identifiable suicidal ideation.  Patients presenting with no risk factors but with morbid ruminations; may be classified as minimal risk based on the severity of the depressive symptoms  Follow-up Information    [facility] Go on 06/11/2017.   Why:  Unk PintoHarvey Bryant will come and pick you up on 06/11/2017 at 7am for peer support services. Unk PintoHarvey Bryant [phone] Contact information: [address] [place] [address] [phone]           Plan Of Care/Follow-up recommendations:  Follow up with [facility] Haskell RilingHolly R Toye Rouillard, MD 06/08/2017, 9:27 AM

## 2017-06-08 NOTE — Progress Notes (Signed)
Patient alert and oriented, Patient states that she will be going home today, patient is bright, denies Si/hi or avh, patient is calm and cooperative, no behavioral issues, states that she is looking forward to going home.

## 2017-06-08 NOTE — Progress Notes (Signed)
Patient with discharge orders, Patient voices understanding of discharge instructions, 7 day supply of medications given to Patient from pharmacy, and prescriptions given. Patient is calm and cooperative, bright and excited to leave and go see her 43 year old son, patient without any signs of distress. Nurse did walk with Patient to meet with niece that was transporting her home. Patient's belongings given back to Patient.

## 2017-06-08 NOTE — Progress Notes (Signed)
Nurse talked with Patient offered encouragement and support and prayer, Patient talked about her faith and how it helps her to cope, and she talked about her family. Patient denies Si/hi or avh, she is safe, q 15 minute checks in progress.

## 2017-06-08 NOTE — Progress Notes (Signed)
  [facility] :  Will you be returning to the same living situation after discharge:  No. Going to stay with Niece.  At discharge, do you have transportation home?: Yes,  Niece or sister will come and pick up patient. Do you have the ability to pay for your medications: Yes,  Referred to a provider who can assist.  Release of information consent forms completed and in the chart;  Patient's signature needed at discharge.  Patient to Follow up at: Follow-up Information    [facility] Go on 06/11/2017.   Why:  Unk PintoHarvey Bryant will come and pick you up on 06/11/2017 at 7am for peer support services. Unk PintoHarvey Bryant [phone] Contact information: [address] [place] [address] [phone]           Next level of care provider has access to [facility] Link:no  Safety Planning and Suicide Prevention discussed: Yes,  Completed with Girtha HakeKandy Chambers, Daughter, [phone]      Has patient been referred to the Quitline?: Patient refused referral  Patient has been referred for addiction treatment: N/A  Johny ShearsCassandra  Dell Briner, LCSW 06/08/2017, 9:17 AM

## 2017-06-08 NOTE — [facility] Group Notes (Signed)
[facility] Group Notes:  (Nursing/MHT/Case Management/Adjunct)  Date:  06/08/2017  Time:  5:45 PM  Type of Therapy:  Psychoeducational Skills  Participation Level:  Active  Participation Quality:  Appropriate and Attentive  Affect:  Appropriate  Cognitive:  Alert and Appropriate  Insight:  Appropriate and Good  Engagement in Group:  Engaged  Modes of Intervention:  Discussion and Education  Summary of Progress/Problems:  Judene CompanionMary  Destony Prevost 06/08/2017, 5:45 PM

## 2017-06-08 NOTE — Tx Team (Signed)
Interdisciplinary Treatment and Diagnostic Plan Update  06/08/2017 Time of Session: 10:30am Loma MessingKimberly K Albornoz MRN: [id number]  Principal Diagnosis: Severe recurrent major depressive disorder with psychotic features [facility](HCC)  Secondary Diagnoses: Principal Problem:   Severe recurrent major depressive disorder with psychotic features [facility](HCC) Active Problems:   Anxiety state   Brief psychotic disorder (HCC)   Current Medications:  Current Facility-Administered Medications  Medication Dose Route Frequency Provider Last Rate Last Dose  . acetaminophen (TYLENOL) tablet 650 mg  650 mg Oral Q6H PRN [name], [name], MD   650 mg at 06/04/17 [id number]  . alum & mag hydroxide-simeth (MAALOX/MYLANTA) 200-200-20 MG/5ML suspension 30 mL  30 mL Oral Q4H PRN [name], [name], MD      . butalbital-acetaminophen-caffeine (FIORICET, ESGIC) [phone] MG per tablet 1 tablet  1 tablet Oral Q6H PRN Aundria Rudakesh, Gopalkumar, MD   1 tablet at 06/08/17 0759  . hydrOXYzine (ATARAX/VISTARIL) tablet 25 mg  25 mg Oral TID PRN [name], [name], MD   25 mg at 06/08/17 0800  . magnesium hydroxide (MILK OF MAGNESIA) suspension 30 mL  30 mL Oral Daily PRN [name], [name], MD      . nicotine (NICODERM CQ - dosed in mg/24 hours) patch 21 mg  21 mg Transdermal Daily [name], [name], MD   21 mg at 06/08/17 0803  . risperiDONE (RISPERDAL) tablet 2 mg  2 mg Oral BID Aundria Rudakesh, Gopalkumar, MD   2 mg at 06/08/17 0758  . venlafaxine (EFFEXOR) tablet 75 mg  75 mg Oral BID WC McNew, Ileene HutchinsonHolly R, MD   75 mg at 06/08/17 [id number]   PTA Medications: Medications Prior to Admission  Medication Sig Dispense Refill Last Dose  . HydrOXYzine HCl (ATARAX PO) Take by mouth.     . Topiramate (TOPAMAX PO) Take by mouth.     . Venlafaxine HCl (EFFEXOR XR PO) Take by mouth.       Patient Stressors: Financial difficulties Medication change or noncompliance  Patient Strengths: Ability for insight Active sense of humor Capable of independent living Communication  skills Supportive family/friends  Treatment Modalities: Medication Management, Group therapy, Case management,  1 to 1 session with clinician, Psychoeducation, Recreational therapy.   Physician Treatment Plan for Primary Diagnosis: Severe recurrent major depressive disorder with psychotic features (HCC) Long Term Goal(s): Improvement in symptoms so as ready for discharge   Short Term Goals: Ability to identify changes in lifestyle to reduce recurrence of condition will improve Ability to verbalize feelings will improve Ability to identify and develop effective coping behaviors will improve Ability to maintain clinical measurements within normal limits will improve Compliance with prescribed medications will improve Ability to identify triggers associated with substance abuse/mental health issues will improve  Medication Management: Evaluate patient's response, side effects, and tolerance of medication regimen.  Therapeutic Interventions: 1 to 1 sessions, Unit Group sessions and Medication administration.  Evaluation of Outcomes: Adequate for Discharge  Physician Treatment Plan for Secondary Diagnosis: Principal Problem:   Severe recurrent major depressive disorder with psychotic features (HCC) Active Problems:   Anxiety state   Brief psychotic disorder (HCC)  Long Term Goal(s): Improvement in symptoms so as ready for discharge   Short Term Goals: Ability to identify changes in lifestyle to reduce recurrence of condition will improve Ability to verbalize feelings will improve Ability to identify and develop effective coping behaviors will improve Ability to maintain clinical measurements within normal limits will improve Compliance with prescribed medications will improve Ability to identify triggers associated with substance abuse/mental  health issues will improve     Medication Management: Evaluate patient's response, side effects, and tolerance of medication  regimen.  Therapeutic Interventions: 1 to 1 sessions, Unit Group sessions and Medication administration.  Evaluation of Outcomes: Adequate for Discharge   RN Treatment Plan for Primary Diagnosis: Severe recurrent major depressive disorder with psychotic features (HCC) Long Term Goal(s): Knowledge of disease and therapeutic regimen to maintain health will improve  Short Term Goals: Ability to participate in decision making will improve, Ability to identify and develop effective coping behaviors will improve and Compliance with prescribed medications will improve  Medication Management: RN will administer medications as ordered by provider, will assess and evaluate patient's response and provide education to patient for prescribed medication. RN will report any adverse and/or side effects to prescribing provider.  Therapeutic Interventions: 1 on 1 counseling sessions, Psychoeducation, Medication administration, Evaluate responses to treatment, Monitor vital signs and CBGs as ordered, Perform/monitor CIWA, COWS, AIMS and Fall Risk screenings as ordered, Perform wound care treatments as ordered.  Evaluation of Outcomes: Adequate for Discharge   LCSW Treatment Plan for Primary Diagnosis: Severe recurrent major depressive disorder with psychotic features (HCC) Long Term Goal(s): Safe transition to appropriate next level of care at discharge, Engage patient in therapeutic group addressing interpersonal concerns.  Short Term Goals: Engage patient in aftercare planning with referrals and resources, Identify triggers associated with mental health/substance abuse issues and Increase skills for wellness and recovery  Therapeutic Interventions: Assess for all discharge needs, 1 to 1 time with Social worker, Explore available resources and support systems, Assess for adequacy in community support network, Educate family and significant other(s) on suicide prevention, Complete Psychosocial Assessment,  Interpersonal group therapy.  Evaluation of Outcomes: Adequate for Discharge   Progress in Treatment: Attending groups: Yes. Participating in groups: Yes. Taking medication as prescribed: Yes. Toleration medication: Yes. Family/Significant other contact made: Yes, completed with Daughter Patient understands diagnosis: TBD-due to confusion Discussing patient identified problems/goals with staff: Yes. Medical problems stabilized or resolved: Yes. Denies suicidal/homicidal ideation: Yes. Issues/concerns per patient self-inventory: No. Other:    New problem(s) identified: No, Describe:     New Short Term/Long Term Goal(s):  Discharge Plan or Barriers: CSW is still assessing nfor appropriate follow up plan.  Reason for Continuation of Hospitalization: None  Estimated Length of Stay: D/C Today  Recreational Therapy: Patient Stressors: SI, Alcohol  Patient Goal: Patient will identify 3 triggers for substance use x5 days.   Attendees: Patient:Megan Yehuda MaoWillard 06/08/2017 11:30 AM  Physician: Dr. Johnella MoloneyMcNew MD 06/08/2017 11:30 AM  Nursing: Hulan AmatoGwen Farrish RN 06/08/2017 11:30 AM  RN Care Manager: 06/08/2017 11:30 AM  Social Worker: [name] Marshal Eskew, LCSW  06/08/2017 11:30 AM  Recreational Therapist: Judithann SheenShae Outlaw, LRT 06/08/2017 11:30 AM  Other: Heidi DachKelsey Craig, LCSW 06/08/2017 11:30 AM  Other:  06/08/2017 11:30 AM  Other: 06/08/2017 11:30 AM    Scribe for Treatment Team: [name]  Brielyn Bosak, LCSW 06/08/2017 11:30 AM

## 2017-06-08 NOTE — [facility] Group Notes (Signed)
06/08/2017 1PM  Type of Therapy and Topic:  Group Therapy:  Feelings around Relapse and Recovery  Participation Level:  Active   Description of Group:    Patients in this group will discuss emotions they experience before and after a relapse. They will process how experiencing these feelings, or avoidance of experiencing them, relates to having a relapse. Facilitator will guide patients to explore emotions they have related to recovery. Patients will be encouraged to process which emotions are more powerful. They will be guided to discuss the emotional reaction significant others in their lives may have to patients' relapse or recovery. Patients will be assisted in exploring ways to respond to the emotions of others without this contributing to a relapse.  Therapeutic Goals: 1. Patient will identify two or more emotions that lead to a relapse for them 2. Patient will identify two emotions that result when they relapse 3. Patient will identify two emotions related to recovery 4. Patient will demonstrate ability to communicate their needs through discussion and/or role plays   Summary of Patient Progress:  Actively and appropriately engaged in the group. Patient was able to provide support and validation to other group members.Patient practiced active listening when interacting with the facilitator and other group members Patient in still in the process of obtaining treatment goals. Megan Mejia discussed how confused she was when she arrived to the hospital. She says "I know that I need to take my medications and work with my providers once I am discharged to maintain my health."   Therapeutic Modalities:   Cognitive Behavioral Therapy Solution-Focused Therapy Assertiveness Training Relapse Prevention Therapy   Megan ShearsCassandra  Cheng Dec, LCSW 06/08/2017 2:17 PM

## 2017-06-28 ENCOUNTER — Encounter: Payer: Self-pay | Admitting: *Deleted

## 2017-06-28 ENCOUNTER — Other Ambulatory Visit: Payer: Self-pay

## 2017-06-28 ENCOUNTER — Emergency Department
Admission: EM | Admit: 2017-06-28 | Discharge: 2017-06-29 | Disposition: A | Discharge status: Other Acute Inpatient Hospital | Payer: Self-pay | Attending: Emergency Medicine | Admitting: Emergency Medicine

## 2017-06-28 DIAGNOSIS — F411 Generalized anxiety disorder: Secondary | ICD-10-CM | POA: Diagnosis present

## 2017-06-28 DIAGNOSIS — F419 Anxiety disorder, unspecified: Secondary | ICD-10-CM | POA: Insufficient documentation

## 2017-06-28 DIAGNOSIS — F17210 Nicotine dependence, cigarettes, uncomplicated: Secondary | ICD-10-CM | POA: Insufficient documentation

## 2017-06-28 DIAGNOSIS — F333 Major depressive disorder, recurrent, severe with psychotic symptoms: Secondary | ICD-10-CM | POA: Insufficient documentation

## 2017-06-28 DIAGNOSIS — Z79899 Other long term (current) drug therapy: Secondary | ICD-10-CM | POA: Insufficient documentation

## 2017-06-28 DIAGNOSIS — I10 Essential (primary) hypertension: Secondary | ICD-10-CM | POA: Insufficient documentation

## 2017-06-28 LAB — COMPREHENSIVE METABOLIC PANEL
ALBUMIN: 3.7 g/dL (ref 3.5–5.0)
ALT: 10 U/L — ABNORMAL LOW (ref 14–54)
ANION GAP: 10 (ref 5–15)
AST: 13 U/L — ABNORMAL LOW (ref 15–41)
Alkaline Phosphatase: 79 U/L (ref 38–126)
BUN: 13 mg/dL (ref 6–20)
CALCIUM: 8.2 mg/dL — AB (ref 8.9–10.3)
CO2: 20 mmol/L — ABNORMAL LOW (ref 22–32)
Chloride: 108 mmol/L (ref 101–111)
Creatinine, Ser: 0.96 mg/dL (ref 0.44–1.00)
GFR calc non Af Amer: >60 mL/min (ref >60)
GLUCOSE: 99 mg/dL (ref 65–99)
POTASSIUM: 3.6 mmol/L (ref 3.5–5.1)
SODIUM: 138 mmol/L (ref 135–145)
Total Bilirubin: 0.4 mg/dL (ref 0.3–1.2)
Total Protein: 6.5 g/dL (ref 6.5–8.1)

## 2017-06-28 LAB — URINE DRUG SCREEN, QUALITATIVE (ARMC ONLY)
AMPHETAMINES, UR SCREEN: NOT DETECTED
BARBITURATES, UR SCREEN: NOT DETECTED
BENZODIAZEPINE, UR SCRN: NOT DETECTED
Cannabinoid 50 Ng, Ur ~~LOC~~: POSITIVE — AB
Cocaine Metabolite,Ur ~~LOC~~: NOT DETECTED
MDMA (Ecstasy)Ur Screen: NOT DETECTED
METHADONE SCREEN, URINE: NOT DETECTED
OPIATE, UR SCREEN: NOT DETECTED
Phencyclidine (PCP) Ur S: NOT DETECTED
TRICYCLIC, UR SCREEN: NOT DETECTED

## 2017-06-28 LAB — CBC
HEMATOCRIT: 31.3 % — AB (ref 35.0–47.0)
Hemoglobin: 10 g/dL — ABNORMAL LOW (ref 12.0–16.0)
MCH: 23.7 pg — ABNORMAL LOW (ref 26.0–34.0)
MCHC: 31.8 g/dL — AB (ref 32.0–36.0)
MCV: 74.5 fL — ABNORMAL LOW (ref 80.0–100.0)
Platelets: 560 10*3/uL — ABNORMAL HIGH (ref 150–440)
RBC: 4.2 MIL/uL (ref 3.80–5.20)
RDW: 17.1 % — ABNORMAL HIGH (ref 11.5–14.5)
WBC: 11.9 10*3/uL — AB (ref 3.6–11.0)

## 2017-06-28 LAB — SALICYLATE LEVEL

## 2017-06-28 LAB — ACETAMINOPHEN LEVEL

## 2017-06-28 LAB — POCT PREGNANCY, URINE: Preg Test, Ur: NEGATIVE

## 2017-06-28 LAB — ETHANOL: Alcohol, Ethyl (B): <10 mg/dL (ref <10)

## 2017-06-28 NOTE — ED Notes (Signed)
ENVIRONMENTAL ASSESSMENT  Potentially harmful objects out of patient reach: Yes.  Personal belongings secured: Yes.  Patient dressed in hospital provided attire only: Yes.  Plastic bags out of patient reach: Yes.  Patient care equipment (cords, cables, call bells, lines, and drains) shortened, removed, or accounted for: Yes.  Equipment and supplies removed from bottom of stretcher: Yes.  Potentially toxic materials out of patient reach: Yes.  Sharps container removed or out of patient reach: Yes.   BEHAVIORAL HEALTH ROUNDING  Patient sleeping: No.  Patient alert and oriented: yes  Behavior appropriate: Yes. ; If no, describe:  Nutrition and fluids offered: Yes  Toileting and hygiene offered: Yes  Sitter present: not applicable, Q 15 min safety rounds and observation via security camera. Law enforcement present: Yes ODS  ED BHU PLACEMENT JUSTIFICATION  Is the patient under IVC or is there intent for IVC: no.  Is the patient medically cleared: Yes.  Is there vacancy in the ED BHU: Yes.  Is the population mix appropriate for patient: Yes.  Is the patient awaiting placement in inpatient or outpatient setting: pending psych consult.  Has the patient had a psychiatric consult: pending  Survey of unit performed for contraband, proper placement and condition of furniture, tampering with fixtures in bathroom, shower, and each patient room: Yes. ; Findings: All clear  APPEARANCE/BEHAVIOR  calm, cooperative and adequate rapport can be established  NEURO ASSESSMENT  Orientation: time, place and person  Hallucinations: pt states she hears voices from the tv but "I can control that, I'm here to get my medications adjusted.  Speech: Normal  Gait: normal  RESPIRATORY ASSESSMENT  WNL  CARDIOVASCULAR ASSESSMENT  WNL  GASTROINTESTINAL ASSESSMENT  WNL  EXTREMITIES  WNL  PLAN OF CARE  Provide calm/safe environment. Vital signs assessed TID. ED BHU Assessment once each 12-hour shift. Collaborate  with TTS daily or as condition indicates. Assure the ED provider has rounded once each shift. Provide and encourage hygiene. Provide redirection as needed. Assess for escalating behavior; address immediately and inform ED provider.  Assess family dynamic and appropriateness for visitation as needed: Yes. ; If necessary, describe findings:  Educate the patient/family about BHU procedures/visitation: Yes. ; If necessary, describe findings: Pt is calm and cooperative at this time. Pt understanding and accepting of unit procedures/rules. Will continue to monitor with Q 15 min safety rounds and observation via security camera.

## 2017-06-28 NOTE — ED Notes (Signed)
BEHAVIORAL HEALTH ROUNDING Patient sleeping: Yes.   Patient alert and oriented: not applicable SLEEPING Behavior appropriate: Yes.  ; If no, describe: SLEEPING Nutrition and fluids offered: No SLEEPING Toileting and hygiene offered: NoSLEEPING Sitter present: not applicable, Q 15 min safety rounds and observation via security camera. Law enforcement present: Yes ODS 

## 2017-06-28 NOTE — ED Provider Notes (Signed)
[facility] Emergency Department Provider Note  ____________________________________________   I have reviewed the triage vital signs and the nursing notes. Where available I have reviewed prior notes and, if possible and indicated, outside hospital notes.    HISTORY  Chief Complaint Behavior Problem    HPI Megan Mejia is a 42 y.o. female who presents today complaining of feeling anxious and stressed.  Patient does have a history of psychiatric disease and she is on Risperdal which was recently started.  She feels that the Risperdal is making her hear voices.  Patient states that she is not having command hallucinations SI or HI but she does feel that the television is talking to her.  She does take marijuana does not drink significant alcohol has never had alcohol withdrawal, denies visual hallucinations.  She is here for further assistance.  She also states that she does smoke marijuana smoked marijuana yesterday, denies any other drug abuse, also states that she Has been feeling quite anxious.     Past Medical History:  Diagnosis Date  . Anxiety   . Depression   . High cholesterol   . Hypertension     Patient Active Problem List   Diagnosis Date Noted  . Brief psychotic disorder (HCC) 06/08/2017  . Severe recurrent major depressive disorder with psychotic features (HCC) 05/30/2017  . Anxiety state 05/30/2017    Past Surgical History:  Procedure Laterality Date  . TONSILLECTOMY      Prior to Admission medications   Medication Sig Start Date End Date Taking? Authorizing Provider  butalbital-acetaminophen-caffeine (FIORICET, ESGIC) 50-325-40 MG tablet Take 1 tablet by mouth every 6 (six) hours as needed for headache. 06/07/17   McNew, Ileene Hutchinson, MD  hydrOXYzine (ATARAX/VISTARIL) 25 MG tablet Take 1 tablet (25 mg total) by mouth 3 (three) times daily as needed for anxiety. 06/07/17   McNew, Ileene Hutchinson, MD  risperiDONE (RISPERDAL) 2 MG tablet Take  1 tablet (2 mg total) by mouth 2 (two) times daily. 06/07/17   McNew, Ileene Hutchinson, MD  venlafaxine (EFFEXOR) 75 MG tablet Take 1 tablet (75 mg total) by mouth 2 (two) times daily with a meal. 06/07/17   McNew, Ileene Hutchinson, MD    Allergies Patient has no known allergies.  No family history on file.  Social History Social History   Tobacco Use  . Smoking status: Current Every Day Smoker    Packs/day: 1.00    Types: Cigarettes  . Smokeless tobacco: Never Used  Substance Use Topics  . Alcohol use: Yes    Comment: every day  . Drug use: Yes    Types: Marijuana    Review of Systems Constitutional: No fever/chills Eyes: No visual changes. ENT: No sore throat. No stiff neck no neck pain Cardiovascular: Denies chest pain. Respiratory: Denies shortness of breath. Gastrointestinal:   no vomiting.  No diarrhea.  No constipation. Genitourinary: Negative for dysuria. Musculoskeletal: Negative lower extremity swelling Skin: Negative for rash. Neurological: Negative for severe headaches, focal weakness or numbness.   ____________________________________________   PHYSICAL EXAM:  VITAL SIGNS: ED Triage Vitals  Enc Vitals Group     BP 06/28/17 1752 140/87     Pulse Rate 06/28/17 1752 94     Resp 06/28/17 1752 20     Temp 06/28/17 1752 98.3 F (36.8 C)     Temp Source 06/28/17 1752 Oral     SpO2 06/28/17 1752 99 %     Weight 06/28/17 1753 158 lb (71.7 kg)  Height 06/28/17 1753 5' (1.524 m)     Head Circumference --      Peak Flow --      Pain Score 06/28/17 1751 3     Pain Loc --      Pain Edu? --      Excl. in GC? --     Constitutional: Alert and oriented. Well appearing and in no acute distress.  He is sleeping when I enter the room after I wake her up by gently talking to her she is wide awake and in no acute distress Eyes: Conjunctivae are normal Head: Atraumatic HEENT: No congestion/rhinnorhea. Mucous membranes are moist.  Oropharynx non-erythematous Neck:   Nontender  with no meningismus, no masses, no stridor Cardiovascular: Normal rate, regular rhythm. Grossly normal heart sounds.  Good peripheral circulation. Respiratory: Normal respiratory effort.  No retractions. Lungs CTAB. Abdominal: Soft and nontender. No distention. No guarding no rebound Back:  There is no focal tenderness or step off.  there is no midline tenderness there are no lesions noted. there is no CVA tenderness Musculoskeletal: No lower extremity tenderness, no upper extremity tenderness. No joint effusions, no DVT signs strong distal pulses no edema Neurologic:  Normal speech and language. No gross focal neurologic deficits are appreciated.  Skin:  Skin is warm, dry and intact. No rash noted. Psychiatric: Mood and affect are somewhat bizarre. Speech and behavior are normal.  ____________________________________________   LABS (all labs ordered are listed, but only abnormal results are displayed)  Labs Reviewed  COMPREHENSIVE METABOLIC PANEL - Abnormal; Notable for the following components:      Result Value   CO2 20 (*)    Calcium 8.2 (*)    AST 13 (*)    ALT 10 (*)    All other components within normal limits  ACETAMINOPHEN LEVEL - Abnormal; Notable for the following components:   Acetaminophen (Tylenol), Serum <10 (*)    All other components within normal limits  CBC - Abnormal; Notable for the following components:   WBC 11.9 (*)    Hemoglobin 10.0 (*)    HCT 31.3 (*)    MCV 74.5 (*)    MCH 23.7 (*)    MCHC 31.8 (*)    RDW 17.1 (*)    Platelets 560 (*)    All other components within normal limits  URINE DRUG SCREEN, QUALITATIVE ([facility] ONLY) - Abnormal; Notable for the following components:   Cannabinoid 50 Ng, Ur [place] POSITIVE (*)    All other components within normal limits  ETHANOL  SALICYLATE LEVEL  POC URINE PREG, ED  POCT PREGNANCY, URINE    Pertinent labs  results that were available during my care of the patient were reviewed by me and considered in my  medical decision making (see chart for details). ____________________________________________  EKG  I personally interpreted any EKGs ordered by me or triage  ____________________________________________  RADIOLOGY  Pertinent labs & imaging results that were available during my care of the patient were reviewed by me and considered in my medical decision making (see chart for details). If possible, patient and/or family made aware of any abnormal findings.  No results found. ____________________________________________    PROCEDURES  Procedure(s) performed: None  Procedures  Critical Care performed: None  ____________________________________________   INITIAL IMPRESSION / ASSESSMENT AND PLAN / ED COURSE  Pertinent labs & imaging results that were available during my care of the patient were reviewed by me and considered in my medical decision making (see chart for  details).  Patient here with hallucinations and anxiety.  She is in no acute distress denies SI or HI she is here voluntarily we will see if we can get her evaluated by psychiatry.   ____________________________________________   FINAL CLINICAL IMPRESSION(S) / ED DIAGNOSES  Final diagnoses:  None      This chart was dictated using voice recognition software.  Despite best efforts to proofread,  errors can occur which can change meaning.      Jeanmarie PlantMcShane, James A, MD 06/28/17 Jerene Bears1920

## 2017-06-28 NOTE — ED Triage Notes (Signed)
Pt reports feeling anxious.  Pt states she is on new meds for past 3 weeks and states she thinks dosages need to be changed.  Pt reports not thinking clearly, sleepy a lot.  Denies si or hi.  Denies etoh use or drug use today.  Used marijuana yesterday .

## 2017-06-28 NOTE — ED Notes (Signed)
poct pregnancy Negative 

## 2017-06-28 NOTE — ED Notes (Signed)
ED Provider at bedside. 

## 2017-06-29 ENCOUNTER — Other Ambulatory Visit: Payer: Self-pay

## 2017-06-29 ENCOUNTER — Inpatient Hospital Stay
Admission: AD | Admit: 2017-06-29 | Discharge: 2017-07-03 | DRG: 885 | Disposition: A | Discharge status: Home / Self Care | Payer: No Typology Code available for payment source | Source: Intra-hospital | Attending: Psychiatry | Admitting: Psychiatry

## 2017-06-29 DIAGNOSIS — I10 Essential (primary) hypertension: Secondary | ICD-10-CM | POA: Diagnosis present

## 2017-06-29 DIAGNOSIS — F411 Generalized anxiety disorder: Secondary | ICD-10-CM | POA: Diagnosis present

## 2017-06-29 DIAGNOSIS — G4700 Insomnia, unspecified: Secondary | ICD-10-CM | POA: Diagnosis present

## 2017-06-29 DIAGNOSIS — F1220 Cannabis dependence, uncomplicated: Secondary | ICD-10-CM | POA: Diagnosis present

## 2017-06-29 DIAGNOSIS — F333 Major depressive disorder, recurrent, severe with psychotic symptoms: Principal | ICD-10-CM | POA: Diagnosis present

## 2017-06-29 DIAGNOSIS — G43909 Migraine, unspecified, not intractable, without status migrainosus: Secondary | ICD-10-CM | POA: Diagnosis present

## 2017-06-29 DIAGNOSIS — F339 Major depressive disorder, recurrent, unspecified: Secondary | ICD-10-CM | POA: Diagnosis present

## 2017-06-29 DIAGNOSIS — E8881 Metabolic syndrome: Secondary | ICD-10-CM | POA: Diagnosis present

## 2017-06-29 DIAGNOSIS — E7800 Pure hypercholesterolemia, unspecified: Secondary | ICD-10-CM | POA: Diagnosis present

## 2017-06-29 DIAGNOSIS — F17210 Nicotine dependence, cigarettes, uncomplicated: Secondary | ICD-10-CM | POA: Diagnosis present

## 2017-06-29 DIAGNOSIS — R45851 Suicidal ideations: Secondary | ICD-10-CM | POA: Diagnosis present

## 2017-06-29 DIAGNOSIS — F17200 Nicotine dependence, unspecified, uncomplicated: Secondary | ICD-10-CM | POA: Diagnosis present

## 2017-06-29 MED ORDER — TOPIRAMATE 100 MG PO TABS
100.0000 mg | ORAL_TABLET | Freq: Every day | ORAL | Status: DC
  Administered 2017-06-29: 100 mg via ORAL
  Filled 2017-06-29: qty 1

## 2017-06-29 MED ORDER — VENLAFAXINE HCL 37.5 MG PO TABS
75.0000 mg | ORAL_TABLET | Freq: Two times a day (BID) | ORAL | Status: DC
  Filled 2017-06-29: qty 1

## 2017-06-29 MED ORDER — MAGNESIUM HYDROXIDE 400 MG/5ML PO SUSP: 30.0000 mL | Freq: Every day | ORAL | Status: DC | PRN

## 2017-06-29 MED ORDER — ACETAMINOPHEN 325 MG PO TABS
650.0000 mg | ORAL_TABLET | Freq: Four times a day (QID) | ORAL | Status: DC | PRN
  Administered 2017-06-30 – 2017-07-01 (×3): 650 mg via ORAL
  Filled 2017-06-29 (×3): qty 2

## 2017-06-29 MED ORDER — HYDROXYZINE HCL 50 MG PO TABS
50.0000 mg | ORAL_TABLET | Freq: Four times a day (QID) | ORAL | Status: DC | PRN
  Administered 2017-06-29 – 2017-07-03 (×7): 50 mg via ORAL
  Filled 2017-06-29 (×7): qty 1

## 2017-06-29 MED ORDER — HYDROXYZINE HCL 25 MG PO TABS: 25.0000 mg | ORAL_TABLET | Freq: Four times a day (QID) | ORAL | Status: DC | PRN

## 2017-06-29 MED ORDER — LORAZEPAM 0.5 MG PO TABS
0.5000 mg | ORAL_TABLET | Freq: Four times a day (QID) | ORAL | Status: DC | PRN
  Administered 2017-06-30 – 2017-07-03 (×2): 0.5 mg via ORAL
  Filled 2017-06-29 (×2): qty 1

## 2017-06-29 MED ORDER — ACETAMINOPHEN 325 MG PO TABS
650.0000 mg | ORAL_TABLET | Freq: Four times a day (QID) | ORAL | Status: DC | PRN
  Administered 2017-06-29: 650 mg via ORAL
  Filled 2017-06-29: qty 2

## 2017-06-29 MED ORDER — RISPERIDONE 1 MG PO TABS
2.0000 mg | ORAL_TABLET | Freq: Two times a day (BID) | ORAL | Status: DC
  Filled 2017-06-29: qty 2

## 2017-06-29 MED ORDER — NICOTINE 21 MG/24HR TD PT24
21.0000 mg | MEDICATED_PATCH | Freq: Once | TRANSDERMAL | Status: DC
  Administered 2017-06-29: 21 mg via TRANSDERMAL

## 2017-06-29 MED ORDER — HALOPERIDOL 0.5 MG PO TABS: 2.0000 mg | ORAL_TABLET | Freq: Four times a day (QID) | ORAL | Status: DC | PRN

## 2017-06-29 MED ORDER — HALOPERIDOL 2 MG PO TABS: 2.0000 mg | ORAL_TABLET | Freq: Four times a day (QID) | ORAL | Status: DC | PRN

## 2017-06-29 MED ORDER — VENLAFAXINE HCL 37.5 MG PO TABS
75.0000 mg | ORAL_TABLET | Freq: Two times a day (BID) | ORAL | Status: DC
  Administered 2017-06-29: 75 mg via ORAL
  Filled 2017-06-29: qty 1

## 2017-06-29 MED ORDER — NICOTINE 21 MG/24HR TD PT24
MEDICATED_PATCH | TRANSDERMAL | Status: AC
  Administered 2017-06-29: 21 mg via TRANSDERMAL
  Filled 2017-06-29: qty 1

## 2017-06-29 MED ORDER — ACETAMINOPHEN 325 MG PO TABS: 650.0000 mg | ORAL_TABLET | Freq: Four times a day (QID) | ORAL | Status: DC | PRN

## 2017-06-29 MED ORDER — RISPERIDONE 1 MG PO TABS
2.0000 mg | ORAL_TABLET | Freq: Two times a day (BID) | ORAL | Status: DC
  Administered 2017-06-30: 1 mg via ORAL
  Filled 2017-06-29 (×2): qty 2

## 2017-06-29 MED ORDER — ALUM & MAG HYDROXIDE-SIMETH 200-200-20 MG/5ML PO SUSP: 30.0000 mL | ORAL | Status: DC | PRN

## 2017-06-29 MED ORDER — VENLAFAXINE HCL 37.5 MG PO TABS
75.0000 mg | ORAL_TABLET | Freq: Two times a day (BID) | ORAL | Status: DC
  Administered 2017-06-30 – 2017-07-01 (×3): 75 mg via ORAL
  Filled 2017-06-29 (×5): qty 2

## 2017-06-29 MED ORDER — TRAZODONE HCL 100 MG PO TABS
100.0000 mg | ORAL_TABLET | Freq: Every evening | ORAL | Status: DC | PRN
  Administered 2017-06-30 (×2): 100 mg via ORAL
  Filled 2017-06-29 (×2): qty 1

## 2017-06-29 MED ORDER — LORAZEPAM 0.5 MG PO TABS
0.5000 mg | ORAL_TABLET | Freq: Four times a day (QID) | ORAL | Status: DC | PRN
  Administered 2017-06-29: 0.5 mg via ORAL
  Filled 2017-06-29: qty 1

## 2017-06-29 NOTE — Plan of Care (Signed)
Patient is oriented to unit. Patient's safety is maintained.    Progressing Education: Knowledge of [place] [facility] Education information/materials will improve 06/29/2017 2004 - Progressing by Addison Naegelieynolds, Chia Mowers I, RN Safety: Ability to remain free from injury will improve 06/29/2017 2004 - Progressing by Berkley Harveyeynolds, Pollyann Roa I, RN

## 2017-06-29 NOTE — ED Notes (Signed)
BEHAVIORAL HEALTH ROUNDING Patient sleeping: Yes.   Patient alert and oriented: not applicable SLEEPING Behavior appropriate: Yes.  ; If no, describe: SLEEPING Nutrition and fluids offered: No SLEEPING Toileting and hygiene offered: NoSLEEPING Sitter present: not applicable, Q 15 min safety rounds and observation via security camera. Law enforcement present: Yes ODS 

## 2017-06-29 NOTE — Progress Notes (Signed)
D: Patient denies SI/HI/AVH. Patient verbally contracts for safety. Patient is calm, cooperative and pleasant. Patient is seen in milieu interacting with peers. Patient has complaints of increasing anxiety. Patient informs this nurse that she is here to "get my medications right." Patient is receptive to education and information provided this shift.   A: Patient was assessed by this nurse. Patient was oriented to unit. Patient's safety was maintained on unit. Q x 15 minute observation checks were completed for safety. Patient care plan was reviewed. Patient was offered support and encouragement. Patient was encourage to attend groups, participate in unit activities and continue with plan of care.   R: Patient has no complaints of pain at this time. Patient is receptive to treatment and safety maintained on unit.

## 2017-06-29 NOTE — ED Notes (Signed)
Pt up to bathroom.

## 2017-06-29 NOTE — ED Notes (Signed)
Pt awake and up to use the bathroom. Pt then asked for a Nicoderm patch. Will request order from MD. Pt is calm and cooperative at this time.

## 2017-06-29 NOTE — ED Notes (Signed)
BEHAVIORAL HEALTH ROUNDING  Patient sleeping: No.  Patient alert and oriented: yes  Behavior appropriate: Yes. ; If no, describe:  Nutrition and fluids offered: Yes  Toileting and hygiene offered: Yes  Sitter present: not applicable, Q 15 min safety rounds and observation via security camera. Law enforcement present: Yes ODS  

## 2017-06-29 NOTE — ED Provider Notes (Signed)
-----------------------------------------   1:36 AM on 06/29/2017 -----------------------------------------  Psych SOC says patient is psychotic and needs admission.  I reviewed the written report and added the PRN medication recommendations.     ----------------------------------------- 6:08 AM on 06/29/2017 -----------------------------------------   Blood pressure 127/75, pulse 94, temperature 97.7 F (36.5 C), temperature source Oral, resp. rate 18, height 1.524 m (5'), weight 71.7 kg (158 lb), last menstrual period 05/12/2017, SpO2 96 %.  The patient had no acute events since last update.  Calm and cooperative at this time.  Disposition is pending Psychiatry/Behavioral Medicine team recommendations.     Loleta RoseForbach, Ashleyann Shoun, MD 06/29/17 [phone]

## 2017-06-29 NOTE — ED Notes (Signed)
Dr. York CeriseForbach [facility] reference psychosis.

## 2017-06-29 NOTE — Plan of Care (Signed)
New admission

## 2017-06-29 NOTE — BH Assessment (Addendum)
[facility] Dr. Toni Amendlapacs.  Attending Physician will be Dr. Jennet MaduroPucilowska.   Patient has been [facility] to room 316B, [facility] [place] .   Intake Paper Work has been signed and placed on patient chart.  ER staff is aware of the admission Misty Stanley( [facility].; Dr. Merrily PewPudow[place]i, ER MD; Lincoln Maxinlivette Patient's Nurse & Mertie ClauseJeanelle Patient Access).

## 2017-06-29 NOTE — ED Provider Notes (Signed)
-----------------------------------------   2:24 PM on 06/29/2017 -----------------------------------------  Patient has been seen by psychiatry they will be admitting to their service for further treatment.   Minna AntisPaduchowski, Analicia Skibinski, MD 06/29/17 1424

## 2017-06-29 NOTE — Tx Team (Signed)
Initial Treatment Plan 06/29/2017 5:29 PM Loma MessingKimberly K Inabinet ZOX:[id number]    PATIENT STRESSORS: Financial difficulties Legal issue Medication change or noncompliance Substance abuse   PATIENT STRENGTHS: Ability for insight General fund of knowledge Motivation for treatment/growth Supportive family/friends   PATIENT IDENTIFIED PROBLEMS: Medication issues  Unemployed  Legal issues                 DISCHARGE CRITERIA:  Ability to meet basic life and health needs Adequate post-discharge living arrangements Improved stabilization in mood, thinking, and/or behavior  PRELIMINARY DISCHARGE PLAN: Outpatient therapy Return to previous living arrangement  PATIENT/FAMILY INVOLVEMENT: This treatment plan has been presented to and reviewed with the patient, Loma MessingKimberly K [place].  The patient has been given the opportunity to ask questions and make suggestions.  Nguyet Mercer, RN 06/29/2017, 5:29 PM

## 2017-06-29 NOTE — Consult Note (Signed)
[facility] Psychiatry Consult   Reason for Consult: Consult for 42 year old woman with a history of psychotic symptoms who came back voluntarily to the hospital Referring Physician: Paduchowski Patient Identification: Megan Mejia MRN:  [id number] Principal Diagnosis: Severe recurrent major depressive disorder with psychotic features [facility]) Diagnosis:   Patient Active Problem List   Diagnosis Date Noted  . Severe recurrent major depressive disorder with psychotic features ([place]) [F33.3] 05/30/2017    Priority: High  . Anxiety state [F41.1] 05/30/2017    Priority: Medium  . 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 female patient admitted with "I do not think my medicines are working".  HPI: Patient interviewed chart reviewed.  This is a patient with a history of psychotic and mood symptoms.  Came to the emergency room last night.  She presents her history as being that she does not think her medicines are working.  When I got down to what her symptoms are she tells me she is still hearing voices and believes that the television is talking with her.  Feels nervous and depressed most of the time.  Cannot sleep very well.  Feels agitated and frightening.  She claims that she has been compliant with her Risperdal and Effexor.  She tells me she ran out of the Vistaril.  Asked me several times to be back on Topamax.  Admits that she is using marijuana daily.  Drinks some alcohol but minimizes it.  She has had some passive suicidal thoughts without intent.  No homicidal ideation.  Medical history: Patient has a history of recurrent migraine headaches otherwise medically stable.  Substance abuse history: Abuses marijuana and occasionally drinks too much not any other active substance abuse problem.  Social history: She has been living with her sister and her sister's family.  Not working.  Stays  around the house most of the time feeling nervous.  Past Psychiatric History: Patient has a history of positive past admissions to the hospital.  Past suicidal ideation.  Has been on several prior medications.  Last time she was here just a few weeks ago it was described that she was doing rather well on the risperidone.  Risk to Self: Suicidal Ideation: No Suicidal Intent: No Is patient at risk for suicide?: No Suicidal Plan?: No Access to Means: No What has been your use of drugs/alcohol within the last 12 months?: alcohol and THC use daily  How many times?: 3 Other Self Harm Risks: None reported  Triggers for Past Attempts: None known Intentional Self Injurious Behavior: None Risk to Others: Homicidal Ideation: No Thoughts of Harm to Others: No Current Homicidal Intent: No Current Homicidal Plan: No Access to Homicidal Means: No Identified Victim: None reported  History of harm to others?: No Assessment of Violence: None Noted Violent Behavior Description: None reported  Does patient have access to weapons?: No Criminal Charges Pending?: No Does patient have a court date: No Prior Inpatient Therapy: Prior Inpatient Therapy: Yes Prior Therapy Dates: 05/27/2017(12/20/2011; 05/10/2011) Prior Therapy Facilty/Provider(s): [facility]  Reason for Treatment: Depression Prior Outpatient Therapy: Prior Outpatient Therapy: No Prior Therapy Dates: None reported  Prior Therapy Facilty/Provider(s): None reported  Reason for Treatment: N/A Does patient have an ACCT team?: No Does patient have Intensive In-House Services?  : No Does patient have [facility] services? : No Does patient have P4CC services?: No  Past Medical History:  Past Medical  History:  Diagnosis Date  . Anxiety   . Depression   . High cholesterol   . Hypertension     Past Surgical History:  Procedure Laterality Date  . TONSILLECTOMY     Family History: No family history on file. Family Psychiatric  History: Her father  had schizophrenia Social History:  Social History   Substance and Sexual Activity  Alcohol Use Yes   Comment: every day     Social History   Substance and Sexual Activity  Drug Use Yes  . Types: Marijuana    Social History   Socioeconomic History  . Marital status: Divorced    Spouse name: None  . Number of children: None  . Years of education: None  . Highest education level: None  Social Needs  . Financial resource strain: None  . Food insecurity - worry: None  . Food insecurity - inability: None  . Transportation needs - medical: None  . Transportation needs - non-medical: None  Occupational History  . None  Tobacco Use  . Smoking status: Current Every Day Smoker    Packs/day: 1.00    Types: Cigarettes  . Smokeless tobacco: Never Used  Substance and Sexual Activity  . Alcohol use: Yes    Comment: every day  . Drug use: Yes    Types: Marijuana  . Sexual activity: Not Currently  Other Topics Concern  . None  Social History Narrative  . None   Additional Social History:    Allergies:  No Known Allergies  Labs:  Results for orders placed or performed during the hospital encounter of 06/28/17 (from the past 48 hour(s))  Comprehensive metabolic panel     Status: Abnormal   Collection Time: 06/28/17  5:55 PM  Result Value Ref Range   Sodium 138 135 - 145 mmol/L   Potassium 3.6 3.5 - 5.1 mmol/L   Chloride 108 101 - 111 mmol/L   CO2 20 (L) 22 - 32 mmol/L   Glucose, Bld 99 65 - 99 mg/dL   BUN 13 6 - 20 mg/dL   Creatinine, Ser 0.96 0.44 - 1.00 mg/dL   Calcium 8.2 (L) 8.9 - 10.3 mg/dL   Total Protein 6.5 6.5 - 8.1 g/dL   Albumin 3.7 3.5 - 5.0 g/dL   AST 13 (L) 15 - 41 U/L   ALT 10 (L) 14 - 54 U/L   Alkaline Phosphatase 79 38 - 126 U/L   Total Bilirubin 0.4 0.3 - 1.2 mg/dL   GFR calc non Af Amer >60 >60 mL/min   GFR calc Af Amer >60 >60 mL/min    Comment: (NOTE) The eGFR has been calculated using the CKD EPI equation. This calculation has not been  validated in all clinical situations. eGFR's persistently <60 mL/min signify possible Chronic Kidney Disease.    Anion gap 10 5 - 15    Comment: Performed at [facility], [address] [place], [place] [address]  Ethanol     Status: None   Collection Time: 06/28/17  5:55 PM  Result Value Ref Range   Alcohol, Ethyl (B) <10 <10 mg/dL    Comment:        LOWEST DETECTABLE LIMIT FOR SERUM ALCOHOL IS 10 mg/dL FOR MEDICAL PURPOSES ONLY Performed at [facility], [address] [place], [place] [address]   Salicylate level     Status: None   Collection Time: 06/28/17  5:55 PM  Result Value Ref Range   Salicylate Lvl <9.3 2.8 - 30.0  mg/dL    Comment: Performed at [facility], [address] [place], [place] [address]  Acetaminophen level     Status: Abnormal   Collection Time: 06/28/17  5:55 PM  Result Value Ref Range   Acetaminophen (Tylenol), Serum <10 (L) 10 - 30 ug/mL    Comment:        THERAPEUTIC CONCENTRATIONS VARY SIGNIFICANTLY. A RANGE OF 10-30 ug/mL MAY BE AN EFFECTIVE CONCENTRATION FOR MANY PATIENTS. HOWEVER, SOME ARE BEST TREATED AT CONCENTRATIONS OUTSIDE THIS RANGE. ACETAMINOPHEN CONCENTRATIONS >150 ug/mL AT 4 HOURS AFTER INGESTION AND >50 ug/mL AT 12 HOURS AFTER INGESTION ARE OFTEN ASSOCIATED WITH TOXIC REACTIONS. Performed at [facility], [address] [place], [place] [address]   cbc     Status: Abnormal   Collection Time: 06/28/17  5:55 PM  Result Value Ref Range   WBC 11.9 (H) 3.6 - 11.0 K/uL   RBC 4.20 3.80 - 5.20 MIL/uL   Hemoglobin 10.0 (L) 12.0 - 16.0 g/dL   HCT 31.3 (L) 35.0 - 47.0 %   MCV 74.5 (L) 80.0 - 100.0 fL   MCH 23.7 (L) 26.0 - 34.0 pg   MCHC 31.8 (L) 32.0 - 36.0 g/dL   RDW 17.1 (H) 11.5 - 14.5 %   Platelets 560 (H) 150 - 440 K/uL    Comment: Performed at [facility], [address], [place], [place] [address]  Urine Drug Screen, Qualitative     Status: Abnormal   Collection Time:  06/28/17  5:55 PM  Result Value Ref Range   Tricyclic, Ur Screen NONE DETECTED NONE DETECTED   Amphetamines, Ur Screen NONE DETECTED NONE DETECTED   MDMA (Ecstasy)Ur Screen NONE DETECTED NONE DETECTED   Cocaine Metabolite,Ur [place] NONE DETECTED NONE DETECTED   Opiate, Ur Screen NONE DETECTED NONE DETECTED   Phencyclidine (PCP) Ur S NONE DETECTED NONE DETECTED   Cannabinoid 50 Ng, Ur [place] POSITIVE (A) NONE DETECTED   Barbiturates, Ur Screen NONE DETECTED NONE DETECTED   Benzodiazepine, Ur Scrn NONE DETECTED NONE DETECTED   Methadone Scn, Ur NONE DETECTED NONE DETECTED    Comment: (NOTE) Tricyclics + metabolites, urine    Cutoff 1000 ng/mL Amphetamines + metabolites, urine  Cutoff 1000 ng/mL MDMA (Ecstasy), urine              Cutoff 500 ng/mL Cocaine Metabolite, urine          Cutoff 300 ng/mL Opiate + metabolites, urine        Cutoff 300 ng/mL Phencyclidine (PCP), urine         Cutoff 25 ng/mL Cannabinoid, urine                 Cutoff 50 ng/mL Barbiturates + metabolites, urine  Cutoff 200 ng/mL Benzodiazepine, urine              Cutoff 200 ng/mL Methadone, urine                   Cutoff 300 ng/mL The urine drug screen provides only a preliminary, unconfirmed analytical test result and should not be used for non-medical purposes. Clinical consideration and professional judgment should be applied to any positive drug screen result due to possible interfering substances. A more specific alternate chemical method must be used in order to obtain a confirmed analytical result. Gas chromatography / mass spectrometry (GC/MS) is the preferred confirmat ory method. Performed at [facility], [address], [place], [place] [address]   Pregnancy, urine POC  Status: None   Collection Time: 06/28/17  6:03 PM  Result Value Ref Range   Preg Test, Ur NEGATIVE NEGATIVE    Comment:        THE SENSITIVITY OF THIS METHODOLOGY IS >24 mIU/mL     Current Facility-Administered  Medications  Medication Dose Route Frequency Provider Last Rate Last Dose  . acetaminophen (TYLENOL) tablet 650 mg  650 mg Oral Q6H PRN Arleatha Philipps T, MD   650 mg at 06/29/17 1439  . haloperidol (HALDOL) tablet 2 mg  2 mg Oral Q6H PRN Hinda Kehr, MD      . hydrOXYzine (ATARAX/VISTARIL) tablet 25 mg  25 mg Oral Q6H PRN Katalena Malveaux T, MD      . LORazepam (ATIVAN) tablet 0.5 mg  0.5 mg Oral Q6H PRN Hinda Kehr, MD   0.5 mg at 06/29/17 1017  . nicotine (NICODERM CQ - dosed in mg/24 hours) patch 21 mg  21 mg Transdermal Once Hinda Kehr, MD   21 mg at 06/29/17 0640  . risperiDONE (RISPERDAL) tablet 2 mg  2 mg Oral BID Jamorion Gomillion T, MD      . venlafaxine [facility]) tablet 75 mg  75 mg Oral BID WC Emon Lance, Madie Reno, MD       Current Outpatient Medications  Medication Sig Dispense Refill  . butalbital-acetaminophen-caffeine (FIORICET, ESGIC) 50-325-40 MG tablet Take 1 tablet by mouth every 6 (six) hours as needed for headache. 14 tablet 0  . risperiDONE (RISPERDAL) 2 MG tablet Take 1 tablet (2 mg total) by mouth 2 (two) times daily. 60 tablet 1  . hydrOXYzine (ATARAX/VISTARIL) 25 MG tablet Take 1 tablet (25 mg total) by mouth 3 (three) times daily as needed for anxiety. 60 tablet 1  . venlafaxine (EFFEXOR) 75 MG tablet Take 1 tablet (75 mg total) by mouth 2 (two) times daily with a meal. 60 tablet 1    Musculoskeletal: Strength & Muscle Tone: within normal limits Gait & Station: normal Patient leans: N/A  Psychiatric Specialty Exam: Physical Exam  Nursing note and vitals reviewed. Constitutional: She appears well-developed and well-nourished.  HENT:  Head: Normocephalic and atraumatic.  Eyes: Conjunctivae are normal. Pupils are equal, round, and reactive to light.  Neck: Normal range of motion.  Cardiovascular: Regular rhythm and normal heart sounds.  Respiratory: Effort normal. No respiratory distress.  GI: Soft.  Musculoskeletal: Normal range of motion.  Neurological: She is  alert.  Skin: Skin is warm and dry.  Psychiatric: Her mood appears anxious. Her affect is blunt. Her speech is delayed and tangential. She is not agitated, not aggressive and not hyperactive. Thought content is paranoid. Cognition and memory are impaired. She expresses impulsivity. She exhibits a depressed mood. She expresses suicidal ideation. She expresses no suicidal plans.    Review of Systems  Constitutional: Negative.   HENT: Negative.   Eyes: Negative.   Respiratory: Negative.   Cardiovascular: Negative.   Gastrointestinal: Negative.   Musculoskeletal: Negative.   Skin: Negative.   Neurological: Negative.   Psychiatric/Behavioral: Positive for depression, hallucinations, substance abuse and suicidal ideas. Negative for memory loss. The patient is nervous/anxious and has insomnia.     Blood pressure 127/75, pulse 94, temperature 97.7 F (36.5 C), temperature source Oral, resp. rate 18, height 5' (1.524 m), weight 71.7 kg (158 lb), last menstrual period 05/12/2017, SpO2 96 %.Body mass index is 30.86 kg/m.  General Appearance: Casual  Eye Contact:  Minimal  Speech:  Slow  Volume:  Decreased  Mood:  Anxious, Depressed  and Dysphoric  Affect:  Constricted  Thought Process:  Goal Directed  Orientation:  Full (Time, Place, and Person)  Thought Content:  Delusions, Hallucinations: Auditory and Ideas of Reference:   Paranoia  Suicidal Thoughts:  Yes.  without intent/plan  Homicidal Thoughts:  No  Memory:  Immediate;   Fair Recent;   Fair Remote;   Fair  Judgement:  Fair  Insight:  Fair  Psychomotor Activity:  Decreased  Concentration:  Concentration: Fair  Recall:  [employer] of Knowledge:  Fair  Language:  Fair  Akathisia:  No  Handed:  Right  AIMS (if indicated):     Assets:  Desire for Improvement Housing Physical Health Resilience  ADL's:  Intact  Cognition:  Impaired,  Mild  Sleep:        Treatment Plan Summary: Daily contact with patient to assess and evaluate  symptoms and progress in treatment, Medication management and Plan 42 year old woman with a history of psychotic and mood disorder returns to the hospital complaining of clear psychotic symptoms.  She claims she has been compliant with her medicine but given how much she is complaining about her risk Toradol and a little questionable of it.  She is agreeable to voluntarily being readmitted to the hospital.  Continue risperidone and Effexor as previously.  PRN medicine available.  Full set of labs can be obtained.  15-minute checks.  Disposition: Recommend psychiatric Inpatient admission when medically cleared. Supportive therapy provided about ongoing stressors.  Alethia Berthold, MD 06/29/2017 3:21 PM
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
## 2017-06-30 DIAGNOSIS — F333 Major depressive disorder, recurrent, severe with psychotic symptoms: Principal | ICD-10-CM

## 2017-06-30 MED ORDER — NICOTINE 14 MG/24HR TD PT24
14.0000 mg | MEDICATED_PATCH | Freq: Every day | TRANSDERMAL | Status: DC
  Administered 2017-06-30 – 2017-07-01 (×2): 14 mg via TRANSDERMAL
  Filled 2017-06-30 (×2): qty 1

## 2017-06-30 MED ORDER — ZIPRASIDONE HCL 20 MG PO CAPS
20.0000 mg | ORAL_CAPSULE | Freq: Two times a day (BID) | ORAL | Status: DC
  Administered 2017-06-30 – 2017-07-01 (×2): 20 mg via ORAL
  Filled 2017-06-30 (×2): qty 1

## 2017-06-30 MED ORDER — TOPIRAMATE 100 MG PO TABS
100.0000 mg | ORAL_TABLET | Freq: Two times a day (BID) | ORAL | Status: DC
  Administered 2017-06-30 – 2017-07-03 (×7): 100 mg via ORAL
  Filled 2017-06-30 (×7): qty 1

## 2017-06-30 NOTE — BHH Group Notes (Signed)
LCSW Group Therapy Note   06/30/2017 1:15pm   Type of Therapy and Topic:  Group Therapy:  Trust and Honesty  Participation Level:  Did Not Attend  Description of Group:    In this group patients will be asked to explore the value of being honest.  Patients will be guided to discuss their thoughts, feelings, and behaviors related to honesty and trusting in others. Patients will process together how trust and honesty relate to forming relationships with peers, family members, and self. Each patient will be challenged to identify and express feelings of being vulnerable. Patients will discuss reasons why people are dishonest and identify alternative outcomes if one was truthful (to self or others). This group will be process-oriented, with patients participating in exploration of their own experiences, giving and receiving support, and processing challenge from other group members.   Therapeutic Goals: 1. Patient will identify why honesty is important to relationships and how honesty overall affects relationships.  2. Patient will identify a situation where they lied or were lied too and the  feelings, thought process, and behaviors surrounding the situation 3. Patient will identify the meaning of being vulnerable, how that feels, and how that correlates to being honest with self and others. 4. Patient will identify situations where they could have told the truth, but instead lied and explain reasons of dishonesty.   Summary of Patient Progress    Therapeutic Modalities:   Cognitive Behavioral Therapy Solution Focused Therapy Motivational Interviewing Brief Therapy  Genie Mirabal  CUEBAS-COLON, LCSW 06/30/2017 12:57 PM  

## 2017-06-30 NOTE — [facility] Suicide Risk Assessment (Signed)
[facility] INPATIENT:  Family/Significant Other Suicide Prevention Education  Suicide Prevention Education:  Patient Refusal for Family/Significant Other Suicide Prevention Education: The patient Megan Mejia has refused to provide written consent for family/significant other to be provided Family/Significant Other Suicide Prevention Education during admission and/or prior to discharge.  Physician notified.  Ravneet Spilker  CUEBAS-COLON 06/30/2017, 11:30 AM
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
## 2017-06-30 NOTE — Progress Notes (Signed)
Effexor medication not given at assigned time due to medication not being available. Pharmacy contacted Twice about medication not in medication drawers or pyxis.

## 2017-06-30 NOTE — H&P (Signed)
Psychiatric Admission Assessment Adult  Patient Identification: Megan Mejia MRN:  [id number] Date of Evaluation:  06/30/2017 Chief Complaint:  Depression Principal Diagnosis: Severe recurrent major depressive disorder with psychotic features ([place]) Diagnosis:   Patient Active Problem List   Diagnosis Date Noted  . Severe recurrent major depressive disorder with psychotic features ([place]) [F33.3] 06/29/2017  . Cannabis use disorder, moderate, dependence ([place]) [F12.20] 06/29/2017  . Tobacco use disorder [F17.200] 06/29/2017  . 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 Anxiety state [F41.1] 05/30/2017   History of Present Illness:  "[name] had a fight, my medicine is not working" Pt admited from ED. 42 year old woman with a history of psychotic symptoms who came back voluntarily to the hospital. Pt  was inpatinet last month from 05/29/17- 06/08/17 for psychosis, was discharged on Risperdal and effexor. Per consult note, patient with a history of psychotic and mood symptoms, came to the emergency room ,   she does not think her medicines are working, and psychotic.   she is still hearing voices and believes that the television is talking with her.  Feels nervous and depressed most of the time.  Cannot sleep very well.  Feels agitated and frightening.  She claims that she has been compliant with her Risperdal and Effexor.  She tells me she ran out of the Vistaril.  Asked me several times to be back on Topamax.  Admits that she is using marijuana daily.  Drinks some alcohol but minimizes it.  She has had some passive suicidal thoughts without intent.  No homicidal ideation. Pt very anxious and guarded, admits hearing voices and TV talking to her, believes risperdal making her worse, states she will not take it, wants sth that does not cause wt gain and sedation, agrees to start geodon, willing to continue to continue other meds, asked to continue topamax for migraine. Endorses  depression, anxiety, poor sleep. Denies SI/HI. Admits using THC daily. UDS+ ve for THC, alcohol level less than 10.     Associated Signs/Symptoms: Depression Symptoms:  depressed mood, insomnia, anxiety, (Hypo) Manic Symptoms:  Irritable Mood, Anxiety Symptoms:  Excessive Worry, Psychotic Symptoms:  Delusions, Hallucinations: Auditory PTSD Symptoms:  Total Time spent with patient: 1 hour  Past Psychiatric History: depression, substance abuse, anxiety. Patient has a history of positive past admissions to the hospital.  Past suicidal ideation.  Has been on several prior medications.  Last time she was here just a few weeks ago it was described that she was doing rather well on the risperidone.      Is the patient at risk to self? Yes.    Has the patient been a risk to self in the past 6 months? No.  Has the patient been a risk to self within the distant past? No.  Is the patient a risk to others? No.  Has the patient been a risk to others in the past 6 months? No.  Has the patient been a risk to others within the distant past? No.   Prior Inpatient Therapy:   Prior Outpatient Therapy:    Alcohol Screening: 1. How often do you have a drink containing alcohol?: Monthly or less 2. How many drinks containing alcohol do you have on a typical day when you are drinking?: 5 or 6 3. How often do you have six or more drinks on one occasion?: Monthly AUDIT-C Score: 5 4. How often during the last year have you found that you were not able to stop  drinking once you had started?: Never 5. How often during the last year have you failed to do what was normally expected from you becasue of drinking?: Never 6. How often during the last year have you needed a first drink in the morning to get yourself going after a heavy drinking session?: Never 7. How often during the last year have you had a feeling of guilt of remorse after drinking?: Never 8. How often during the last year have you been unable  to remember what happened the night before because you had been drinking?: Never 9. Have you or someone else been injured as a result of your drinking?: No 10. Has a relative or friend or a doctor or another health worker been concerned about your drinking or suggested you cut down?: No Alcohol Use Disorder Identification Test Final Score (AUDIT): 5 Intervention/Follow-up: AUDIT Score <7 follow-up not indicated Substance Abuse History in the last 12 months:  Yes.    Abuses marijuana and occasionally drinks too much not any other active substance abuse problem. Smokes 1PPD cigarettes.   Consequences of Substance Abuse:  Previous Psychotropic Medications: Yes  Psychological Evaluations: Yes  Past Medical History:  Past Medical History:  Diagnosis Date  . Anxiety   . Depression   . High cholesterol   . Hypertension     Past Surgical History:  Procedure Laterality Date  . TONSILLECTOMY     Family History: History reviewed. No pertinent family history. Family Psychiatric  History: unknown Tobacco Screening: Have you used any form of tobacco in the last 30 days? (Cigarettes, Smokeless Tobacco, Cigars, and/or Pipes): Yes Tobacco use, Select all that apply: 5 or more cigarettes per day Are you interested in Tobacco Cessation Medications?: Yes, will notify MD for an order Counseled patient on smoking cessation including recognizing danger situations, developing coping skills and basic information about quitting provided: Refused/Declined practical counseling Social History:   Social History   Substance and Sexual Activity  Alcohol Use Yes   Comment: every day     Social History   Substance and Sexual Activity  Drug Use Yes  . Types: Marijuana    Additional Social History:      She has been living with her sister and her sister's family.  Not working.   Pain Medications: see PTA Prescriptions: see PTA Over the Counter: see PTA History of alcohol / drug use?: Yes                     Allergies:  No Known Allergies Lab Results:  Results for orders placed or performed during the hospital encounter of 06/28/17 (from the past 48 hour(s))  Comprehensive metabolic panel     Status: Abnormal   Collection Time: 06/28/17  5:55 PM  Result Value Ref Range   Sodium 138 135 - 145 mmol/L   Potassium 3.6 3.5 - 5.1 mmol/L   Chloride 108 101 - 111 mmol/L   CO2 20 (L) 22 - 32 mmol/L   Glucose, Bld 99 65 - 99 mg/dL   BUN 13 6 - 20 mg/dL   Creatinine, Ser 0.96 0.44 - 1.00 mg/dL   Calcium 8.2 (L) 8.9 - 10.3 mg/dL   Total Protein 6.5 6.5 - 8.1 g/dL   Albumin 3.7 3.5 - 5.0 g/dL   AST 13 (L) 15 - 41 U/L   ALT 10 (L) 14 - 54 U/L   Alkaline Phosphatase 79 38 - 126 U/L   Total Bilirubin 0.4 0.3 - 1.2 mg/dL  GFR calc non Af Amer >60 >60 mL/min   GFR calc Af Amer >60 >60 mL/min    Comment: (NOTE) The eGFR has been calculated using the CKD EPI equation. This calculation has not been validated in all clinical situations. eGFR's persistently <60 mL/min signify possible Chronic Kidney Disease.    Anion gap 10 5 - 15    Comment: Performed at [facility], [address] [place], [place] [address]  Ethanol     Status: None   Collection Time: 06/28/17  5:55 PM  Result Value Ref Range   Alcohol, Ethyl (B) <10 <10 mg/dL    Comment:        LOWEST DETECTABLE LIMIT FOR SERUM ALCOHOL IS 10 mg/dL FOR MEDICAL PURPOSES ONLY Performed at [facility], [address] [place], [place] [address]   Salicylate level     Status: None   Collection Time: 06/28/17  5:55 PM  Result Value Ref Range   Salicylate Lvl <0.3 2.8 - 30.0 mg/dL    Comment: Performed at [facility], [address] [place], [place] [address]  Acetaminophen level     Status: Abnormal   Collection Time: 06/28/17  5:55 PM  Result Value Ref Range   Acetaminophen (Tylenol), Serum <10 (L) 10 - 30 ug/mL    Comment:        THERAPEUTIC CONCENTRATIONS VARY SIGNIFICANTLY. A RANGE OF  10-30 ug/mL MAY BE AN EFFECTIVE CONCENTRATION FOR MANY PATIENTS. HOWEVER, SOME ARE BEST TREATED AT CONCENTRATIONS OUTSIDE THIS RANGE. ACETAMINOPHEN CONCENTRATIONS >150 ug/mL AT 4 HOURS AFTER INGESTION AND >50 ug/mL AT 12 HOURS AFTER INGESTION ARE OFTEN ASSOCIATED WITH TOXIC REACTIONS. Performed at [facility], [address] [place], [place] [address]   cbc     Status: Abnormal   Collection Time: 06/28/17  5:55 PM  Result Value Ref Range   WBC 11.9 (H) 3.6 - 11.0 K/uL   RBC 4.20 3.80 - 5.20 MIL/uL   Hemoglobin 10.0 (L) 12.0 - 16.0 g/dL   HCT 31.3 (L) 35.0 - 47.0 %   MCV 74.5 (L) 80.0 - 100.0 fL   MCH 23.7 (L) 26.0 - 34.0 pg   MCHC 31.8 (L) 32.0 - 36.0 g/dL   RDW 17.1 (H) 11.5 - 14.5 %   Platelets 560 (H) 150 - 440 K/uL    Comment: Performed at [facility], [address], [place], [place] [address]  Urine Drug Screen, Qualitative     Status: Abnormal   Collection Time: 06/28/17  5:55 PM  Result Value Ref Range   Tricyclic, Ur Screen NONE DETECTED NONE DETECTED   Amphetamines, Ur Screen NONE DETECTED NONE DETECTED   MDMA (Ecstasy)Ur Screen NONE DETECTED NONE DETECTED   Cocaine Metabolite,Ur [place] NONE DETECTED NONE DETECTED   Opiate, Ur Screen NONE DETECTED NONE DETECTED   Phencyclidine (PCP) Ur S NONE DETECTED NONE DETECTED   Cannabinoid 50 Ng, Ur Protivin POSITIVE (A) NONE DETECTED   Barbiturates, Ur Screen NONE DETECTED NONE DETECTED   Benzodiazepine, Ur Scrn NONE DETECTED NONE DETECTED   Methadone Scn, Ur NONE DETECTED NONE DETECTED    Comment: (NOTE) Tricyclics + metabolites, urine    Cutoff 1000 ng/mL Amphetamines + metabolites, urine  Cutoff 1000 ng/mL MDMA (Ecstasy), urine              Cutoff 500 ng/mL Cocaine Metabolite, urine          Cutoff 300 ng/mL Opiate + metabolites, urine        Cutoff  300 ng/mL Phencyclidine (PCP), urine         Cutoff 25 ng/mL Cannabinoid, urine                 Cutoff 50 ng/mL Barbiturates + metabolites, urine  Cutoff  200 ng/mL Benzodiazepine, urine              Cutoff 200 ng/mL Methadone, urine                   Cutoff 300 ng/mL The urine drug screen provides only a preliminary, unconfirmed analytical test result and should not be used for non-medical purposes. Clinical consideration and professional judgment should be applied to any positive drug screen result due to possible interfering substances. A more specific alternate chemical method must be used in order to obtain a confirmed analytical result. Gas chromatography / mass spectrometry (GC/MS) is the preferred confirmat ory method. Performed at [facility], [address] [place], [place] [address]   Pregnancy, urine POC     Status: None   Collection Time: 06/28/17  6:03 PM  Result Value Ref Range   Preg Test, Ur NEGATIVE NEGATIVE    Comment:        THE SENSITIVITY OF THIS METHODOLOGY IS >24 mIU/mL     Blood Alcohol level:  Lab Results  Component Value Date   ETH <10 06/28/2017   ETH <10 59/16/3846    Metabolic Disorder Labs:  Lab Results  Component Value Date   HGBA1C 5.6 05/30/2017   MPG 114.02 05/30/2017   No results found for: PROLACTIN Lab Results  Component Value Date   CHOL 227 (H) 05/30/2017   TRIG 256 (H) 05/30/2017   HDL 33 (L) 05/30/2017   CHOLHDL 6.9 05/30/2017   VLDL 51 (H) 05/30/2017   LDLCALC 143 (H) 05/30/2017   LDLCALC 112 (H) 12/27/2011    Current Medications: Current Facility-Administered Medications  Medication Dose Route Frequency Provider Last Rate Last Dose  . acetaminophen (TYLENOL) tablet 650 mg  650 mg Oral Q6H PRN [name], Madie Reno, MD   650 mg at 06/30/17 0806  . alum & mag hydroxide-simeth (MAALOX/MYLANTA) 200-200-20 MG/5ML suspension 30 mL  30 mL Oral Q4H PRN [name], [name], MD      . haloperidol (HALDOL) tablet 2 mg  2 mg Oral Q6H PRN [name], [name], MD      . hydrOXYzine (ATARAX/VISTARIL) tablet 50 mg  50 mg Oral Q6H PRN [name], Madie Reno, MD   50 mg at 06/30/17 0806  .  LORazepam (ATIVAN) tablet 0.5 mg  0.5 mg Oral Q6H PRN [name], [name], MD      . magnesium hydroxide (MILK OF MAGNESIA) suspension 30 mL  30 mL Oral Daily PRN [name], [name], MD      . nicotine (NICODERM CQ - dosed in mg/24 hours) patch 14 mg  14 mg Transdermal Daily Lenward Chancellor, MD   14 mg at 06/30/17 1109  . topiramate (TOPAMAX) tablet 100 mg  100 mg Oral BID Lenward Chancellor, MD      . traZODone (DESYREL) tablet 100 mg  100 mg Oral QHS PRN [name], Madie Reno, MD   100 mg at 06/30/17 0354  . venlafaxine (EFFEXOR) tablet 75 mg  75 mg Oral BID WC Pucilowska, Jolanta B, MD   75 mg at 06/30/17 0806  . ziprasidone (GEODON) capsule 20 mg  20 mg Oral BID WC Lenward Chancellor, MD       PTA Medications: Medications Prior to Admission  Medication Sig Dispense Refill Last Dose  . butalbital-acetaminophen-caffeine (FIORICET, ESGIC) 50-325-40 MG tablet Take 1 tablet by mouth every 6 (six) hours as needed for headache. 14 tablet 0 PRN at PRN  . hydrOXYzine (ATARAX/VISTARIL) 25 MG tablet Take 1 tablet (25 mg total) by mouth 3 (three) times daily as needed for anxiety. 60 tablet 1   . risperiDONE (RISPERDAL) 2 MG tablet Take 1 tablet (2 mg total) by mouth 2 (two) times daily. 60 tablet 1 N/A at N/A  . venlafaxine (EFFEXOR) 75 MG tablet Take 1 tablet (75 mg total) by mouth 2 (two) times daily with a meal. 60 tablet 1     Musculoskeletal: Strength & Muscle Tone: within normal limits Gait & Station: normal Patient leans:   Psychiatric Specialty Exam: Physical Exam  Nursing note and vitals reviewed.   ROS  Blood pressure 120/79, pulse 94, temperature 98.3 F (36.8 C), temperature source Oral, resp. rate 18, height 5' (1.524 m), weight 70.3 kg (155 lb), SpO2 100 %.Body mass index is 30.27 kg/m.  General Appearance: Casual  Eye Contact:  Minimal  Speech:  Slow  Volume:  Decreased  Mood:  Anxious, Depressed and Dysphoric  Affect:  Constricted  Thought Process:  Goal Directed  Orientation:  Full  (Time, Place, and Person)  Thought Content:  Delusions, Hallucinations: Auditory and Ideas of Reference:   Paranoia, "TV taking to me"  Suicidal Thoughts:  Yes.  without intent/plan  Homicidal Thoughts:  No  Memory:  Immediate;   Fair Recent;   Fair Remote;   Fair  Judgement:  Fair  Insight:  Fair  Psychomotor Activity:  Decreased  Concentration:  Concentration: Fair  Recall:  [employer] of Knowledge:  Fair  Language:  Fair  Akathisia:  No  Handed:  Right  AIMS (if indicated):     Assets:  Desire for Improvement Housing Physical Health Resilience  ADL's:  Intact  Cognition:  Impaired,  Mild  Sleep:          Treatment Plan Summary: Daily contact with patient to assess and evaluate symptoms and progress in treatment and Medication management . Pt is depressed, anxious, psychotic, THC abuse. Pt believes risperdal making her worse, states she will not take it, wants sth that does not cause wt gain and sedation, agrees to start geodon, willing to continue to continue other psych meds( effexor, vistaril), asked to continue topamax for migraine. Nicotine patch for smoking cigarettes. . Voluntary status.   Observation Level/Precautions:  15 minute checks  Laboratory:  CBC Chemistry Profile HCG UDS UA, preg test -neg  Psychotherapy:    Medications:    Consultations:    Discharge Concerns:    Estimated LOS:  Other:     Physician Treatment Plan for Primary Diagnosis: Severe recurrent major depressive disorder with psychotic features ([place]) Long Term Goal(s): Improvement in symptoms so as ready for discharge  Short Term Goals: Ability to identify changes in lifestyle to reduce recurrence of condition will improve, Ability to verbalize feelings will improve, Ability to disclose and discuss suicidal ideas, Ability to demonstrate self-control will improve, Ability to identify and develop effective coping behaviors will improve, Ability to maintain clinical measurements within normal  limits will improve, Compliance with prescribed medications will improve and Ability to identify triggers associated with substance abuse/mental health issues will improve  Physician Treatment Plan for Secondary Diagnosis: Principal Problem:   Severe recurrent major depressive disorder with psychotic features ([place]) Active Problems:   Cannabis use disorder, moderate, dependence ([place])  Tobacco use disorder  Long Term Goal(s): Improvement in symptoms so as ready for discharge  Short Term Goals: Ability to identify changes in lifestyle to reduce recurrence of condition will improve, Ability to verbalize feelings will improve, Ability to disclose and discuss suicidal ideas, Ability to demonstrate self-control will improve, Ability to identify and develop effective coping behaviors will improve, Ability to maintain clinical measurements within normal limits will improve, Compliance with prescribed medications will improve and Ability to identify triggers associated with substance abuse/mental health issues will improve  I certify that inpatient services furnished can reasonably be expected to improve the patient's condition.    Lenward Chancellor, MD 1/19/201911:55 AM

## 2017-07-01 MED ORDER — VENLAFAXINE HCL 37.5 MG PO TABS
75.0000 mg | ORAL_TABLET | Freq: Every day | ORAL | Status: DC
  Administered 2017-07-01: 75 mg via ORAL
  Filled 2017-07-01: qty 1

## 2017-07-01 MED ORDER — VENLAFAXINE HCL 75 MG PO TABS
150.0000 mg | ORAL_TABLET | Freq: Every day | ORAL | Status: DC
  Administered 2017-07-02: 150 mg via ORAL
  Filled 2017-07-01: qty 2

## 2017-07-01 MED ORDER — BUTALBITAL-APAP-CAFFEINE 50-325-40 MG PO TABS
1.0000 | ORAL_TABLET | Freq: Four times a day (QID) | ORAL | Status: DC | PRN
  Administered 2017-07-02 – 2017-07-03 (×5): 1 via ORAL
  Filled 2017-07-01 (×10): qty 1

## 2017-07-01 MED ORDER — NICOTINE 21 MG/24HR TD PT24
21.0000 mg | MEDICATED_PATCH | Freq: Every day | TRANSDERMAL | Status: DC
Start: 2017-07-01 — End: 2017-07-03
  Administered 2017-07-02 – 2017-07-03 (×2): 21 mg via TRANSDERMAL
  Filled 2017-07-01 (×2): qty 1

## 2017-07-01 MED ORDER — TRAZODONE HCL 50 MG PO TABS
150.0000 mg | ORAL_TABLET | Freq: Every evening | ORAL | Status: DC | PRN
  Administered 2017-07-02: 21:00:00 150 mg via ORAL
  Filled 2017-07-01: qty 1

## 2017-07-01 MED ORDER — ZIPRASIDONE HCL 40 MG PO CAPS
40.0000 mg | ORAL_CAPSULE | Freq: Two times a day (BID) | ORAL | Status: DC
  Administered 2017-07-01 – 2017-07-03 (×4): 40 mg via ORAL
  Filled 2017-07-01 (×4): qty 1

## 2017-07-01 MED ORDER — PNEUMOCOCCAL VAC POLYVALENT 25 MCG/0.5ML IJ INJ
0.5000 mL | INJECTION | INTRAMUSCULAR | Status: DC
  Filled 2017-07-01: qty 0.5

## 2017-07-01 NOTE — [facility] Group Notes (Signed)
LCSW Group Therapy Note 07/01/2017 1:15pm  Type of Therapy and Topic: Group Therapy: Feelings Around Returning Home & Establishing a Supportive Framework and Supporting Oneself When Supports Not Available  Participation Level: Active  Description of Group:  Patients first processed thoughts and feelings about upcoming discharge. These included fears of upcoming changes, lack of change, new living environments, judgements and expectations from others and overall stigma of mental health issues. The group then discussed the definition of a supportive framework, what that looks and feels like, and how do to discern it from an unhealthy non-supportive network. The group identified different types of supports as well as what to do when your family/friends are less than helpful or unavailable  Therapeutic Goals  1. Patient will identify one healthy supportive network that they can use at discharge. 2. Patient will identify one factor of a supportive framework and how to tell it from an unhealthy network. 3. Patient able to identify one coping skill to use when they do not have positive supports from others. 4. Patient will demonstrate ability to communicate their needs through discussion and/or role plays.  Summary of Patient Progress:  Pt scored her mood 5 (10 best). Pt engaged during group session. As patients processed their anxiety about discharge and described healthy supports patient shared she accomplished everything she needed to accomplish in the hospital and feels like the medications she is taking are helping her feel better. Patients identified at least one self-care tool they were willing to use after discharge.   Therapeutic Modalities Cognitive Behavioral Therapy Motivational Interviewing   Ayanni Tun  CUEBAS-COLON, LCSW 07/01/2017 11:22 AM

## 2017-07-01 NOTE — Progress Notes (Signed)
D:Pt denies SI/HI/AVH. Pt. Verbally contracts for safety. Pt. States she can remain safe while on the unit. Pt. Upon interaction is cooperative, but anxious and pleasant. Pt. Reports sleeping, "like my usual, I get up super early" and eating, "okay". Pt. Reports anxiety at a "2/10" and depression at a, "5/10" this evening. Pt. Reports feeling, "some what better" today then she did yesterday.     A: Q x 15 minute observation checks were completed for safety. Patient was provided with education. Pt. Verbalizes understanding of provided education.Patient was given scheduled medications. Patient was encourage to attend groups, participate in unit activities and continue with plan of care.   R:Patient is complaint with medication and unit procedures. Pt. Attends groups.             Patient slept for Estimated Hours of 6.15; Precautionary checks every 15 minutes for safety maintained, room free of safety hazards, patient sustains no injury or falls during this shift.

## 2017-07-01 NOTE — Plan of Care (Signed)
Pt. Participates in unit activities and groups. Pt. Reports sleeping, "like my usual, I get up super early". Pt. Verbalizes understanding of provided education. Pt. Compliant with medications. Pt. Denies SI/HI and states she can remain safe while on the unit. Pt. Verbally contracts for safety. Pt. Reports feeling, "some what better" today then she did yesterday.  Progressing Activity: Interest or engagement in activities will improve 07/01/2017 2331 - Progressing by Lenox PondsStevens, Jadier Rockers J, RN Sleeping patterns will improve 07/01/2017 2331 - Progressing by Lenox PondsStevens, Mikeyla Music J, RN Education: Knowledge of [place] [facility] Education information/materials will improve 07/01/2017 2331 - Progressing by Lenox PondsStevens, Amorie Rentz J, RN Emotional status will improve 07/01/2017 2331 - Progressing by Lenox PondsStevens, Kalin Kyler J, RN Mental status will improve 07/01/2017 2331 - Progressing by Lenox PondsStevens, Leng Montesdeoca J, RN Health Behavior/Discharge Planning: Compliance with treatment plan for underlying cause of condition will improve 07/01/2017 2331 - Progressing by Lenox PondsStevens, Adalina Dopson J, RN Safety: Ability to remain free from injury will improve 07/01/2017 2331 - Progressing by Lenox PondsStevens, Alvis Edgell J, RN

## 2017-07-01 NOTE — Plan of Care (Signed)
Patient has shown interest in activities today by interacting in the milieu as well as participating in unit groups without any issues at this time. Patient states that she slept fair last night. Patient verbalizes understanding of the general information that has been provided to her without voicing any further questions or concerns at this time. Patient denies SI/HI/AVH at this time. Patient has been in compliance with treatment/medication regimen. Patient has the ability to identify the available resources/necessary changes in her lifestyle to help reduce the recurrence in her condition. Patient has remained free from injury on the unit thus far and is safe at this time.

## 2017-07-01 NOTE — Plan of Care (Signed)
Pt. Reports sleeping, "good". Pt. Verbalizes understanding of provided education. Pt. Verbally contracts for safety and denies SI/HI and states she can remain safe while on the unit. Pt. Continues to be isolative and withdrawn. Pt. Does not attend groups.  Activity: Sleeping patterns will improve 07/01/2017 0256 - Progressing by Lenox PondsStevens, Dominyk Law J, RN   Education: Knowledge of [place] [facility] Education information/materials will improve 07/01/2017 0256 - Progressing by Lenox PondsStevens, Ruthella Kirchman J, RN   Safety: Periods of time without injury will increase 07/01/2017 0256 - Progressing by Lenox PondsStevens, Jayren Cease J, RN   Safety: Ability to remain free from injury will improve 07/01/2017 0256 - Progressing by Lenox PondsStevens, Ibrahem Volkman J, RN   Activity: Interest or engagement in activities will improve 07/01/2017 0256 - Not Progressing by Lenox PondsStevens, Elowyn Raupp J, RN

## 2017-07-01 NOTE — Progress Notes (Signed)
D:Pt denies SI/HI/AVH. Pt. Verbally contracts for safety. Pt. States she can remain safe while on the unit. Pt. Upon interaction is cooperative, but isolative and withdrawn. Pt. Reports sleeping, "good" and eating, "okay". Pt. Reports anxiety at a 4/10 this evening. Pt. Reports feeling, "about the same, maybe a little better" today then she did yesterday.     A: Q x 15 minute observation checks were completed for safety. Patient was provided with education. Pt. Verbalizes understanding of provided education.Patient was given scheduled medications. Patient  was encourage to attend groups, participate in unit activities and continue with plan of care.   R:Patient is complaint with medication and unit procedures, but does not attend groups.             Patient slept for Estimated Hours of 4.45; Precautionary checks every 15 minutes for safety maintained, room free of safety hazards, patient sustains no injury or falls during this shift.

## 2017-07-01 NOTE — Progress Notes (Signed)
[facility] MD Progress Note  07/01/2017 12:57 PM Megan Mejia  MRN:  [id number] Subjective:   Pt with depression, psychosis admitted voluntarily for med psychosis, med adjustment.    Pt requesting Fioricet for headache, states she was taking 150mg  bid for effexor , but only taking lower dose in hospital requesting to increase it. Took Geodon , denies side effects. Endorses depression, headache, back pain. Denies SI/Hi, denies AVH. Slept 4 hr 45 min blem: Severe recurrent major depressive disorder with psychotic features (HCC) Diagnosis:   Patient Active Problem List   Diagnosis Date Noted  . Severe recurrent major depressive disorder with psychotic features (HCC) [F33.3] 06/29/2017  . Cannabis use disorder, moderate, dependence (HCC) [F12.20] 06/29/2017  . Tobacco use disorder [F17.200] 06/29/2017  . 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 Anxiety state [F41.1] 05/30/2017   Total Time spent with patient: 25 min   Past Psychiatric History: no new info  Past Medical History:  Past Medical History:  Diagnosis Date  . Anxiety   . Depression   . High cholesterol   . Hypertension     Past Surgical History:  Procedure Laterality Date  . TONSILLECTOMY     Family History: History reviewed. No pertinent family history. Family Psychiatric  History: no new info Social History:  Social History   Substance and Sexual Activity  Alcohol Use Yes   Comment: every day     Social History   Substance and Sexual Activity  Drug Use Yes  . Types: Marijuana    Social History   Socioeconomic History  . Marital status: Divorced    Spouse name: None  . Number of children: None  . Years of education: None  . Highest education level: None  Social Needs  . Financial resource strain: None  . Food insecurity - worry: None  . Food insecurity - inability: None  . Transportation needs - medical: None  . Transportation needs - non-medical: None  Occupational History  . None   Tobacco Use  . Smoking status: Current Every Day Smoker    Packs/day: 1.00    Types: Cigarettes  . Smokeless tobacco: Never Used  Substance and Sexual Activity  . Alcohol use: Yes    Comment: every day  . Drug use: Yes    Types: Marijuana  . Sexual activity: Not Currently  Other Topics Concern  . None  Social History Narrative  . None   Additional Social History:    Pain Medications: see PTA Prescriptions: see PTA Over the Counter: see PTA History of alcohol / drug use?: Yes                    Sleep: Poor  Appetite:  Fair  Current Medications: Current Facility-Administered Medications  Medication Dose Route Frequency Provider Last Rate Last Dose  . acetaminophen (TYLENOL) tablet 650 mg  650 mg Oral Q6H PRN [name], [name], MD   650 mg at 07/01/17 4098  . alum & mag hydroxide-simeth (MAALOX/MYLANTA) 200-200-20 MG/5ML suspension 30 mL  30 mL Oral Q4H PRN [name], [name], MD      . butalbital-acetaminophen-caffeine (FIORICET, ESGIC) 50-325-40 MG per tablet 1 tablet  1 tablet Oral Q6H PRN [name], MD      . haloperidol (HALDOL) tablet 2 mg  2 mg Oral Q6H PRN [name], [name], MD      . hydrOXYzine (ATARAX/VISTARIL) tablet 50 mg  50 mg Oral Q6H PRN [name], [name], MD   50 mg at  07/01/17 0534  . LORazepam (ATIVAN) tablet 0.5 mg  0.5 mg Oral Q6H PRN [name], [name], MD   0.5 mg at 06/30/17 1453  . magnesium hydroxide (MILK OF MAGNESIA) suspension 30 mL  30 mL Oral Daily PRN [name], [name], MD      . nicotine (NICODERM CQ - dosed in mg/24 hours) patch 21 mg  21 mg Transdermal Daily Beverly SessionsSubedi, Jacob Cicero, MD      . Melene Muller[START ON 07/02/2017] pneumococcal 23 valent vaccine (PNU-IMMUNE) injection 0.5 mL  0.5 mL Intramuscular Tomorrow-1000 Pucilowska, Jolanta B, MD      . topiramate (TOPAMAX) tablet 100 mg  100 mg Oral BID Beverly SessionsSubedi, Eriel Dunckel, MD   100 mg at 07/01/17 [id number]  . traZODone (DESYREL) tablet 150 mg  150 mg Oral QHS PRN Beverly SessionsSubedi, Blane Worthington, MD      . Melene Muller[START ON 07/02/2017]  venlafaxine [facility](EFFEXOR) tablet 150 mg  150 mg Oral Q breakfast Beverly SessionsSubedi, Kaysey Berndt, MD      . Melene Muller[START ON 07/02/2017] venlafaxine [facility](EFFEXOR) tablet 75 mg  75 mg Oral Q supper Beverly SessionsSubedi, Delane Stalling, MD      . ziprasidone (GEODON) capsule 40 mg  40 mg Oral BID WC Beverly SessionsSubedi, Kenzli Barritt, MD        Lab Results: No results found for this or any previous visit (from the past 48 hour(s)).  Blood Alcohol level:  Lab Results  Component Value Date   ETH <10 06/28/2017   ETH <10 05/27/2017    Metabolic Disorder Labs: Lab Results  Component Value Date   HGBA1C 5.6 05/30/2017   MPG 114.02 05/30/2017   No results found for: PROLACTIN Lab Results  Component Value Date   CHOL 227 (H) 05/30/2017   TRIG 256 (H) 05/30/2017   HDL 33 (L) 05/30/2017   CHOLHDL 6.9 05/30/2017   VLDL 51 (H) 05/30/2017   LDLCALC 143 (H) 05/30/2017   LDLCALC 112 (H) 12/27/2011    Physical Findings: AIMS: Facial and Oral Movements Muscles of Facial Expression: None, normal Lips and Perioral Area: None, normal Jaw: None, normal Tongue: None, normal,Extremity Movements Upper (arms, wrists, hands, fingers): None, normal Lower (legs, knees, ankles, toes): None, normal, Trunk Movements Neck, shoulders, hips: None, normal, Overall Severity Severity of abnormal movements (highest score from questions above): None, normal Incapacitation due to abnormal movements: None, normal Patient's awareness of abnormal movements (rate only patient's report): No Awareness, Dental Status Current problems with teeth and/or dentures?: No(denies) Does patient usually wear dentures?: Yes  CIWA:  CIWA-Ar Total: 0 COWS:  COWS Total Score: 0  Musculoskeletal: Strength & Muscle Tone: within normal limits Gait & Station: normal Patient leans:   Psychiatric Specialty Exam: Physical Exam  ROS  Blood pressure 106/76, pulse 83, temperature 98.3 F (36.8 C), temperature source Oral, resp. rate 18, height 5' (1.524 m), weight 70.3 kg (155 lb), SpO2 100  %.Body mass index is 30.27 kg/m.  General Appearance:Casual  Eye Contact:Minimal  Speech:Slow  Volume:Decreased  Mood:Anxious, Depressed and Dysphoric  Affect:Constricted  Thought Process:Goal Directed  Orientation:Full (Time, Place, and Person)  Thought Content:Delusions, Hallucinations:Auditoryand Ideas of Reference: Paranoia, delusional  Suicidal Thoughts:Yes.without intent/plan  Homicidal Thoughts:No  Memory:Immediate;Fair Recent;Fair Remote;Fair  Judgement:Fair  Insight:Fair  Psychomotor Activity:Decreased  Concentration:Concentration:Fair  Recall:Fair  Fund of Knowledge:Fair  Language:Fair  Akathisia:No  Handed:Right  AIMS (if indicated):   Assets:Desire for Improvement Housing Physical Health Resilience  ADL's:Intact  Cognition:Impaired,Mild  Sleep:            Treatment Plan Summary: Daily contact with patient to assess  and evaluate symptoms and progress in treatment and Medication management . Pt is depressed, anxious, psychotic, THC abuse. Pt believes risperdal making her worse, states she will not take it, wants sth that does not cause wt gain and sedation, agreed to start geodon,  willing to continue to continue other psych meds( effexor, vistaril), asked to continue topamax for migraine.   Increase Geodon for mood/psychosis. Increase Effexor for depression/anxiety. Increase trazodone for sleep.   Nicotine patch for smoking cigarettes.   Fioricet for headache.  Voluntary status.   Observation Level/Precautions:  15 minute checks  Laboratory:  CBC Chemistry Profile HCG UDS UA, preg test -neg  Psychotherapy:    Medications:    Consultations:    Discharge Concerns:    Estimated LOS:  Other:     Physician Treatment Plan for Primary Diagnosis: Severe recurrent major depressive disorder with psychotic features (HCC) Long Term Goal(s): Improvement in symptoms so as ready for  discharge  Short Term Goals: Ability to identify changes in lifestyle to reduce recurrence of condition will improve, Ability to verbalize feelings will improve, Ability to disclose and discuss suicidal ideas, Ability to demonstrate self-control will improve, Ability to identify and develop effective coping behaviors will improve, Ability to maintain clinical measurements within normal limits will improve, Compliance with prescribed medications will improve and Ability to identify triggers associated with substance abuse/mental health issues will improve  Physician Treatment Plan for Secondary Diagnosis: Principal Problem:   Severe recurrent major depressive disorder with psychotic features (HCC) Active Problems:   Cannabis use disorder, moderate, dependence (HCC)   Tobacco use disorder  Long Term Goal(s): Improvement in symptoms so as ready for discharge  Short Term Goals: Ability to identify changes in lifestyle to reduce recurrence of condition will improve, Ability to verbalize feelings will improve, Ability to disclose and discuss suicidal ideas, Ability to demonstrate self-control will improve, Ability to identify and develop effective coping behaviors will improve, Ability to maintain clinical measurements within normal limits will improve, Compliance with prescribed medications will improve and Ability to identify triggers associated with substance abuse/mental health issues will improve  I certify that inpatient services furnished can reasonably be expected to improve the patient's condition.       [name], MD 07/01/2017, 12:57 PM

## 2017-07-01 NOTE — Progress Notes (Signed)
D- Patient alert and oriented. Patient presents in a pleasant mood on assessment but also complains of depression and anxiety. Patient states that her depression level is "4/10" stating that she's depressed because "I need to talk to the doctor so that I can get on my right meds". Patient endorses an anxiety level of "3/10" stating that "maybe I just need my nicotine patch". Patient denies SI, HI, AVH, at this time. Patient's goal for today is "fixing my meds", which she will accomplish by "talk to the doctor". Patient also wants the staff to know "thanks for your help".  A- Scheduled medications administered to patient, per MD orders. Support and encouragement provided.  Routine safety checks conducted every 15 minutes.  Patient informed to notify staff with problems or concerns.  R- No adverse drug reactions noted. Patient contracts for safety at this time. Patient compliant with medications and treatment plan. Patient receptive, calm, and cooperative. Patient interacts well with others on the unit.  Patient remains safe at this time.

## 2017-07-02 MED ORDER — VENLAFAXINE HCL 75 MG PO TABS
150.0000 mg | ORAL_TABLET | Freq: Every day | ORAL | Status: DC
  Administered 2017-07-03: 150 mg via ORAL
  Filled 2017-07-02: qty 2

## 2017-07-02 MED ORDER — TRAZODONE HCL 150 MG PO TABS: 150.0000 mg | ORAL_TABLET | Freq: Every evening | ORAL | 1 refills | Status: DC | PRN

## 2017-07-02 MED ORDER — HYDROXYZINE HCL 50 MG PO TABS: 50.0000 mg | ORAL_TABLET | Freq: Four times a day (QID) | ORAL | 1 refills | Status: DC | PRN

## 2017-07-02 MED ORDER — TOPIRAMATE 100 MG PO TABS: 100.0000 mg | ORAL_TABLET | Freq: Two times a day (BID) | ORAL | 1 refills | Status: DC

## 2017-07-02 MED ORDER — VENLAFAXINE HCL 75 MG PO TABS: 150.0000 mg | ORAL_TABLET | Freq: Every day | ORAL | 1 refills | Status: AC

## 2017-07-02 MED ORDER — VENLAFAXINE HCL 37.5 MG PO TABS
150.0000 mg | ORAL_TABLET | Freq: Two times a day (BID) | ORAL | Status: DC
  Administered 2017-07-02: 150 mg via ORAL
  Filled 2017-07-02: qty 2

## 2017-07-02 MED ORDER — ZIPRASIDONE HCL 40 MG PO CAPS: 40.0000 mg | ORAL_CAPSULE | Freq: Two times a day (BID) | ORAL | 1 refills | Status: DC

## 2017-07-02 MED ORDER — VENLAFAXINE HCL 37.5 MG PO TABS
75.0000 mg | ORAL_TABLET | Freq: Every day | ORAL | Status: DC
  Administered 2017-07-02: 75 mg via ORAL
  Filled 2017-07-02 (×3): qty 1

## 2017-07-02 NOTE — Progress Notes (Signed)
Recreation Therapy Notes  INPATIENT RECREATION THERAPY ASSESSMENT  Patient Details Name: Loma MessingKimberly K Worsley MRN: [id number] DOB: 1975-09-13 Today's Date: 07/02/2017  Patient Stressors: Other (Comment)(Medication change)  Coping Skills:   Music, Other (Comment)(reading)  Personal Challenges: Trusting Others  Leisure Interests (2+):  Crafts - Painting, Social - Friends, Social - Family, Music - Listen(Listen to Programmer, multimediapreacher)  Awareness of Community Resources:  Yes  Community Resources:  Church([facility])  Current Use: Yes  If no, Barriers?:    Patient Strengths:  Good person, Can offer others alot  Patient Identified Areas of Improvement:  Be more independent, Get a job, Pay my own bills  Current Recreation Participation:  N/A  Patient Goal for Hospitalization:  To get medicine changed  [place]  County of Residence:  [place]   Current SI (including self-harm):  No  Current HI:  No  Consent to Intern Participation: N/A   Jamisen Hawes 07/02/2017, 2:32 PM

## 2017-07-02 NOTE — [facility] Group Notes (Signed)
[facility] Group Notes:  (Nursing/MHT/Case Management/Adjunct)  Date:  07/02/2017  Time:  3:09 PM  Type of Therapy:  Psychoeducational Skills  Participation Level:  Active  Participation Quality:  Appropriate  Affect:  Appropriate  Cognitive:  Appropriate  Insight:  Good  Engagement in Group:  Engaged  Modes of Intervention:  Discussion, Education and Support  Summary of Progress/Problems:  Lynelle SmokeCara Travis Solene Hereford 07/02/2017, 3:09 PM

## 2017-07-02 NOTE — Progress Notes (Signed)
Recreation Therapy Notes   Date: 01.21.2019  Time: 1:00 PM  Location: Craft Room  Behavioral response: Appropriate   Intervention Topic: Goals  Discussion/Intervention: Group content on today was focused on goals. Patients described what goals are and how they define goals. Individuals expressed how they go about setting goals and reaching them. The group identified how important goals are and if they make short term goals to reach long term goals. Patients described how many goals they work on at a time and what affects them not reaching their goal. Individuals described how much time they put into planning and obtaining their goals. The group participated in the intervention "My Goal Board" and made personal goal boards to help them achieve their goal.  Clinical Observations/Feedback:  Patient came to group and stated she makes goals for herself to have something to do. She also stated when working on goals it keeps her out of trouble. Individual participated in in the intervention and was social with peers and staff during group.   Lekendrick Alpern LRT/CTRS         Lyberti Thrush 07/02/2017 2:04 PM

## 2017-07-02 NOTE — Progress Notes (Signed)
Patient is alert and oriented X 4. Patient denies SI, HI and AVH. Patient states, " I am here just to get my medications straighten out." Patient is complaint with medications only complains of a headache which is managed with Floricet. Patient attends groups and is active. Patient eating meals adequately, no complaint of sleeping problems. Affect is flat. Nurse will continue to monitor. Safety checks will continue Q 15 minutes.

## 2017-07-02 NOTE — Progress Notes (Signed)
[facility] MD Progress Note  07/02/2017 4:43 PM Megan Mejia  MRN:  [id number]  Subjective:    Megan Mejia met with treatment team today. She does feel better. She is no longer suicidal. She likes her new regimen of medications. No side effects, no somatic complaints. Good group participation. Discharge tomorrow.  Treatment team. She is back on Effexor and Topamax. Geodon was added to her regimen.  Social/dispositon. She will be discharged to home. Follow up with [facility].   Principal Problem: Severe recurrent major depressive disorder with psychotic features ([place]) Diagnosis:   Patient Active Problem List   Diagnosis Date Noted  . Severe recurrent major depressive disorder with psychotic features ([place]) [F33.3] 06/29/2017    Priority: High  . Cannabis use disorder, moderate, dependence ([place]) [F12.20] 06/29/2017  . Tobacco use disorder [F17.200] 06/29/2017  . Brief psychotic disorder ([place]) [F23] 06/08/2017  . Anxiety state [F41.1] 05/30/2017   Total Time spent with patient: 30 minutes  Past Psychiatric History: depression  Past Medical History:  Past Medical History:  Diagnosis Date  . Anxiety   . Depression   . High cholesterol   . Hypertension     Past Surgical History:  Procedure Laterality Date  . TONSILLECTOMY     Family History: History reviewed. No pertinent family history. Family Psychiatric  History: see H&P Social History:  Social History   Substance and Sexual Activity  Alcohol Use Yes   Comment: every day     Social History   Substance and Sexual Activity  Drug Use Yes  . Types: Marijuana    Social History   Socioeconomic History  . Marital status: Divorced    Spouse name: None  . Number of children: None  . Years of education: None  . Highest education level: None  Social Needs  . Financial resource strain: None  . Food insecurity - worry: None  . Food insecurity - inability: None  . Transportation needs - medical: None  . Transportation needs -  non-medical: None  Occupational History  . None  Tobacco Use  . Smoking status: Current Every Day Smoker    Packs/day: 1.00    Types: Cigarettes  . Smokeless tobacco: Never Used  Substance and Sexual Activity  . Alcohol use: Yes    Comment: every day  . Drug use: Yes    Types: Marijuana  . Sexual activity: Not Currently  Other Topics Concern  . None  Social History Narrative  . None   Additional Social History:    Pain Medications: see PTA Prescriptions: see PTA Over the Counter: see PTA History of alcohol / drug use?: Yes                    Sleep: Fair  Appetite:  Fair  Current Medications: Current Facility-Administered Medications  Medication Dose Route Frequency Provider Last Rate Last Dose  . acetaminophen (TYLENOL) tablet 650 mg  650 mg Oral Q6H PRN [name], Madie Reno, MD   650 mg at 07/01/17 1531  . alum & mag hydroxide-simeth (MAALOX/MYLANTA) 200-200-20 MG/5ML suspension 30 mL  30 mL Oral Q4H PRN [name], [name], MD      . butalbital-acetaminophen-caffeine (FIORICET, ESGIC) [phone] MG per tablet 1 tablet  1 tablet Oral Q6H PRN Lenward Chancellor, MD   1 tablet at 07/02/17 1034  . haloperidol (HALDOL) tablet 2 mg  2 mg Oral Q6H PRN [name], [name], MD      . hydrOXYzine (ATARAX/VISTARIL) tablet 50 mg  50 mg  Oral Q6H PRN [name], Madie Reno, MD   50 mg at 07/02/17 0818  . LORazepam (ATIVAN) tablet 0.5 mg  0.5 mg Oral Q6H PRN [name], [name], MD   0.5 mg at 06/30/17 1453  . magnesium hydroxide (MILK OF MAGNESIA) suspension 30 mL  30 mL Oral Daily PRN [name], [name], MD      . nicotine (NICODERM CQ - dosed in mg/24 hours) patch 21 mg  21 mg Transdermal Daily Lenward Chancellor, MD   21 mg at 07/02/17 0818  . pneumococcal 23 valent vaccine (PNU-IMMUNE) injection 0.5 mL  0.5 mL Intramuscular Tomorrow-1000 Jordan Pardini B, MD      . topiramate (TOPAMAX) tablet 100 mg  100 mg Oral BID Tejas Seawood B, MD   100 mg at 07/02/17 0817  . traZODone (DESYREL) tablet  150 mg  150 mg Oral QHS PRN Lenward Chancellor, MD      . Derrill Memo ON 07/03/2017] venlafaxine [facility]) tablet 150 mg  150 mg Oral QAC breakfast Asheley Hellberg B, MD      . venlafaxine (EFFEXOR) tablet 75 mg  75 mg Oral QAC supper Brondon Wann B, MD      . ziprasidone (GEODON) capsule 40 mg  40 mg Oral BID WC Lenward Chancellor, MD   40 mg at 07/02/17 1610    Lab Results: No results found for this or any previous visit (from the past 31 hour(s)).  Blood Alcohol level:  Lab Results  Component Value Date   ETH <10 06/28/2017   ETH <10 96/09/5407    Metabolic Disorder Labs: Lab Results  Component Value Date   HGBA1C 5.6 05/30/2017   MPG 114.02 05/30/2017   No results found for: PROLACTIN Lab Results  Component Value Date   CHOL 227 (H) 05/30/2017   TRIG 256 (H) 05/30/2017   HDL 33 (L) 05/30/2017   CHOLHDL 6.9 05/30/2017   VLDL 51 (H) 05/30/2017   LDLCALC 143 (H) 05/30/2017   LDLCALC 112 (H) 12/27/2011    Physical Findings: AIMS: Facial and Oral Movements Muscles of Facial Expression: None, normal Lips and Perioral Area: None, normal Jaw: None, normal Tongue: None, normal,Extremity Movements Upper (arms, wrists, hands, fingers): None, normal Lower (legs, knees, ankles, toes): None, normal, Trunk Movements Neck, shoulders, hips: None, normal, Overall Severity Severity of abnormal movements (highest score from questions above): None, normal Incapacitation due to abnormal movements: None, normal Patient's awareness of abnormal movements (rate only patient's report): No Awareness, Dental Status Current problems with teeth and/or dentures?: No(denies) Does patient usually wear dentures?: Yes  CIWA:  CIWA-Ar Total: 0 COWS:  COWS Total Score: 0  Musculoskeletal: Strength & Muscle Tone: within normal limits Gait & Station: normal Patient leans: N/A  Psychiatric Specialty Exam: Physical Exam  Nursing note and vitals reviewed. Psychiatric: Her speech is normal and  behavior is normal. Thought content normal. Cognition and memory are normal. She expresses impulsivity. She exhibits a depressed mood.    Review of Systems  Neurological: Negative.   Psychiatric/Behavioral: Positive for depression and substance abuse.  All other systems reviewed and are negative.   Blood pressure 115/81, pulse 76, temperature 98.2 F (36.8 C), temperature source Oral, resp. rate 16, height 5' (1.524 m), weight 70.3 kg (155 lb), SpO2 100 %.Body mass index is 30.27 kg/m.  General Appearance: Casual  Eye Contact:  Good  Speech:  Clear and Coherent  Volume:  Normal  Mood:  Depressed  Affect:  Appropriate  Thought Process:  Goal Directed and Descriptions of  Associations: Intact  Orientation:  Full (Time, Place, and Person)  Thought Content:  WDL  Suicidal Thoughts:  No  Homicidal Thoughts:  No  Memory:  Immediate;   Fair Recent;   Fair Remote;   Fair  Judgement:  Fair  Insight:  Present  Psychomotor Activity:  Normal  Concentration:  Concentration: Fair and Attention Span: Fair  Recall:  [employer] of Knowledge:  Fair  Language:  Fair  Akathisia:  No  Handed:  Right  AIMS (if indicated):     Assets:  Communication Skills Desire for Improvement Housing Physical Health Resilience Social Support  ADL's:  Intact  Cognition:  WNL  Sleep:        Treatment Plan Summary: Daily contact with patient to assess and evaluate symptoms and progress in treatment and Medication management   Ms. Mans is a 42 year old female with a history of depression, anxiety and psychosis admitted for worsening of her symptoms. She believes risperdal making her worse.   #Mood and psychosis -continue Effexor 150 mg in AM and 75 mg in PM -continue Geodon 40 mg BID -continue Trazodone 100 mg nightly -continue Topamax 100 mg BID -continue Vistaril 50 mg TID PRN  #Migraine -Topamax for headache prevention -Firicet PRN  #Metabolic syndrome monitoring -Lipid panel, TSH, EKG and  HgbA1C were done recently  #Cannabis abuse -minimizes problems and declines treatment  #Smoking cessation -Nicotine patch is available  #Disposition -discharge to home -follow up with [facility]   Orson Slick, MD 07/02/2017, 4:43 PM

## 2017-07-02 NOTE — Tx Team (Signed)
Interdisciplinary Treatment and Diagnostic Plan Update  07/02/2017 Time of Session: 10:30am Megan Mejia MRN: [id number]  Principal Diagnosis: Severe recurrent major depressive disorder with psychotic features [facility])  Secondary Diagnoses: Principal Problem:   Severe recurrent major depressive disorder with psychotic features (HCC) Active Problems:   Cannabis use disorder, moderate, dependence (HCC)   Tobacco use disorder   Current Medications:  Current Facility-Administered Medications  Medication Dose Route Frequency Provider Last Rate Last Dose  . acetaminophen (TYLENOL) tablet 650 mg  650 mg Oral Q6H PRN Mejia, Megan Denmark, MD   650 mg at 07/01/17 1531  . alum & mag hydroxide-simeth (MAALOX/MYLANTA) 200-200-20 MG/5ML suspension 30 mL  30 mL Oral Q4H PRN Mejia, Megan T, MD      . butalbital-acetaminophen-caffeine (FIORICET, ESGIC) [phone] MG per tablet 1 tablet  1 tablet Oral Q6H PRN Megan Sessions, MD   1 tablet at 07/02/17 1034  . haloperidol (HALDOL) tablet 2 mg  2 mg Oral Q6H PRN Mejia, Megan T, MD      . hydrOXYzine (ATARAX/VISTARIL) tablet 50 mg  50 mg Oral Q6H PRN Mejia, Megan Denmark, MD   50 mg at 07/02/17 0818  . LORazepam (ATIVAN) tablet 0.5 mg  0.5 mg Oral Q6H PRN Mejia, Megan T, MD   0.5 mg at 06/30/17 1453  . magnesium hydroxide (MILK OF MAGNESIA) suspension 30 mL  30 mL Oral Daily PRN Mejia, Megan T, MD      . nicotine (NICODERM CQ - dosed in mg/24 hours) patch 21 mg  21 mg Transdermal Daily Megan Sessions, MD   21 mg at 07/02/17 0818  . pneumococcal 23 valent vaccine (PNU-IMMUNE) injection 0.5 mL  0.5 mL Intramuscular Tomorrow-1000 Mejia, Megan B, MD      . topiramate (TOPAMAX) tablet 100 mg  100 mg Oral BID Mejia, Megan B, MD   100 mg at 07/02/17 0817  . traZODone (DESYREL) tablet 150 mg  150 mg Oral QHS PRN Megan Sessions, MD      . venlafaxine [facility]) tablet 150 mg  150 mg Oral BID WC Mejia, Megan B, MD      . ziprasidone (GEODON)  capsule 40 mg  40 mg Oral BID WC Megan Sessions, MD   40 mg at 07/02/17 2841   PTA Medications: Medications Prior to Admission  Medication Sig Dispense Refill Last Dose  . butalbital-acetaminophen-caffeine (FIORICET, ESGIC) 50-325-40 MG tablet Take 1 tablet by mouth every 6 (six) hours as needed for headache. 14 tablet 0 PRN at PRN  . hydrOXYzine (ATARAX/VISTARIL) 25 MG tablet Take 1 tablet (25 mg total) by mouth 3 (three) times daily as needed for anxiety. 60 tablet 1   . risperiDONE (RISPERDAL) 2 MG tablet Take 1 tablet (2 mg total) by mouth 2 (two) times daily. 60 tablet 1 N/A at N/A  . venlafaxine (EFFEXOR) 75 MG tablet Take 1 tablet (75 mg total) by mouth 2 (two) times daily with a meal. 60 tablet 1     Patient Stressors: Financial difficulties Legal issue Medication change or noncompliance Substance abuse  Patient Strengths: Ability for insight General fund of knowledge Motivation for treatment/growth Supportive family/friends  Treatment Modalities: Medication Management, Group therapy, Case management,  1 to 1 session with clinician, Psychoeducation, Recreational therapy.   Physician Treatment Plan for Primary Diagnosis: Severe recurrent major depressive disorder with psychotic features (HCC) Long Term Goal(s): Improvement in symptoms so as ready for discharge Improvement in symptoms so as ready for discharge   Short Term Goals: Ability to identify  changes in lifestyle to reduce recurrence of condition will improve Ability to verbalize feelings will improve Ability to disclose and discuss suicidal ideas Ability to demonstrate self-control will improve Ability to identify and develop effective coping behaviors will improve Ability to maintain clinical measurements within normal limits will improve Compliance with prescribed medications will improve Ability to identify triggers associated with substance abuse/mental health issues will improve Ability to identify changes  in lifestyle to reduce recurrence of condition will improve Ability to verbalize feelings will improve Ability to disclose and discuss suicidal ideas Ability to demonstrate self-control will improve Ability to identify and develop effective coping behaviors will improve Ability to maintain clinical measurements within normal limits will improve Compliance with prescribed medications will improve Ability to identify triggers associated with substance abuse/mental health issues will improve  Medication Management: Evaluate patient's response, side effects, and tolerance of medication regimen.  Therapeutic Interventions: 1 to 1 Mejia, Unit Group Mejia and Medication administration.  Evaluation of Outcomes: Progressing  Physician Treatment Plan for Secondary Diagnosis: Principal Problem:   Severe recurrent major depressive disorder with psychotic features (HCC) Active Problems:   Cannabis use disorder, moderate, dependence (HCC)   Tobacco use disorder  Long Term Goal(s): Improvement in symptoms so as ready for discharge Improvement in symptoms so as ready for discharge   Short Term Goals: Ability to identify changes in lifestyle to reduce recurrence of condition will improve Ability to verbalize feelings will improve Ability to disclose and discuss suicidal ideas Ability to demonstrate self-control will improve Ability to identify and develop effective coping behaviors will improve Ability to maintain clinical measurements within normal limits will improve Compliance with prescribed medications will improve Ability to identify triggers associated with substance abuse/mental health issues will improve Ability to identify changes in lifestyle to reduce recurrence of condition will improve Ability to verbalize feelings will improve Ability to disclose and discuss suicidal ideas Ability to demonstrate self-control will improve Ability to identify and develop effective coping  behaviors will improve Ability to maintain clinical measurements within normal limits will improve Compliance with prescribed medications will improve Ability to identify triggers associated with substance abuse/mental health issues will improve     Medication Management: Evaluate patient's response, side effects, and tolerance of medication regimen.  Therapeutic Interventions: 1 to 1 Mejia, Unit Group Mejia and Medication administration.  Evaluation of Outcomes: Progressing   RN Treatment Plan for Primary Diagnosis: Severe recurrent major depressive disorder with psychotic features (HCC) Long Term Goal(s): Knowledge of disease and therapeutic regimen to maintain health will improve  Short Term Goals: Ability to identify and develop effective coping behaviors will improve and Compliance with prescribed medications will improve  Medication Management: RN will administer medications as ordered by provider, will assess and evaluate patient's response and provide education to patient for prescribed medication. RN will report any adverse and/or side effects to prescribing provider.  Therapeutic Interventions: 1 on 1 counseling Mejia, Psychoeducation, Medication administration, Evaluate responses to treatment, Monitor vital signs and CBGs as ordered, Perform/monitor CIWA, COWS, AIMS and Fall Risk screenings as ordered, Perform wound care treatments as ordered.  Evaluation of Outcomes: Progressing   LCSW Treatment Plan for Primary Diagnosis: Severe recurrent major depressive disorder with psychotic features (HCC) Long Term Goal(s): Safe transition to appropriate next level of care at discharge, Engage patient in therapeutic group addressing interpersonal concerns.  Short Term Goals: Engage patient in aftercare planning with referrals and resources, Identify triggers associated with mental health/substance abuse issues and Increase skills for wellness and  recovery  Therapeutic  Interventions: Assess for all discharge needs, 1 to 1 time with Child psychotherapist, Explore available resources and support systems, Assess for adequacy in community support network, Educate family and significant other(s) on suicide prevention, Complete Psychosocial Assessment, Interpersonal group therapy.  Evaluation of Outcomes: Progressing   Progress in Treatment: Attending groups: Yes. Participating in groups: Yes. Taking medication as prescribed: Yes. Toleration medication: Yes. Family/Significant other contact made: No, will contact:   Pt refused Patient understands diagnosis: Yes. Discussing patient identified problems/goals with staff: Yes. Medical problems stabilized or resolved: Yes. Denies suicidal/homicidal ideation: Yes. Issues/concerns per patient self-inventory: No. Other:    New problem(s) identified: No, Describe:     New Short Term/Long Term Goal(s): "To get back on the same medications as before."  Discharge Plan or Barriers: To home and follow up with [facility]  Reason for Continuation of Hospitalization: Anxiety Medication stabilization Other; describe aftercare coordination  Estimated Length of Stay: 1-2days  Recreational Therapy: Patient Stressors: Medication change  Patient Goal: Patient will participate actively in groups and activities x5 days.  Attendees: Patient:Megan Mejia 07/02/2017 1:49 PM  Physician: Kristine Linea, MD 07/02/2017 1:49 PM  Nursing: Leonia Reader, RN 07/02/2017 1:49 PM  RN Care Manager: 07/02/2017 1:49 PM  Social Worker: Jake Shark, LCSW 07/02/2017 1:49 PM  Recreational Therapist: Garret Reddish, LRT 07/02/2017 1:49 PM  Other:  07/02/2017 1:49 PM  Other:  07/02/2017 1:49 PM  Other: 07/02/2017 1:49 PM    Scribe for Treatment Team: Glennon Mac, LCSW 07/02/2017 1:49 PM

## 2017-07-02 NOTE — [facility] Group Notes (Signed)
07/02/2017 9:30AM  Type of Therapy and Topic:  Group Therapy:  Overcoming Obstacles  Participation Level:  Active    Description of Group:    In this group patients will be encouraged to explore what they see as obstacles to their own wellness and recovery. They will be guided to discuss their thoughts, feelings, and behaviors related to these obstacles. The group will process together ways to cope with barriers, with attention given to specific choices patients can make. Each patient will be challenged to identify changes they are motivated to make in order to overcome their obstacles. This group will be process-oriented, with patients participating in exploration of their own experiences as well as giving and receiving support and challenge from other group members.   Therapeutic Goals: 1. Patient will identify personal and current obstacles as they relate to admission. 2. Patient will identify barriers that currently interfere with their wellness or overcoming obstacles.  3. Patient will identify feelings, thought process and behaviors related to these barriers. 4. Patient will identify two changes they are willing to make to overcome these obstacles:      Summary of Patient Progress Actively and appropriately engaged in the group. Patient was able to provide support and validation to other group members.Patient practiced active listening when interacting with the facilitator and other group members Patient in still in the process of obtaining treatment goals. Megan Mejia says that she came into the hospital to get her medications right. She says that "when I leave here, I want to get a job, that will help me with many obstacles that I have."    Therapeutic Modalities:   Cognitive Behavioral Therapy Solution Focused Therapy Motivational Interviewing Relapse Prevention Therapy    Johny ShearsCassandra Erline Siddoway MSW, [facility]ba Patterson Health CenterCSWA 07/02/2017 10:37 AM

## 2017-07-02 NOTE — Plan of Care (Signed)
Patient is alert and oriented X 4. Patient is appropriate on the unit with staff and peers. Patient is compliant with medications. Only complains of having headache which is managed with Floricet. Patient affect is flat, continues to be appropriate with staff and peers. Patient attends groups and is active. Patient states, " I am here to just get my medications corrected."  Patient has no complaints about sleep; appetite is good. Nurse will continue to monitor. Safety checks Q 15 minutes.

## 2017-07-03 MED ORDER — TOPIRAMATE 100 MG PO TABS: 100.0000 mg | ORAL_TABLET | Freq: Every day | ORAL | 1 refills | Status: DC

## 2017-07-03 MED ORDER — TOPIRAMATE 100 MG PO TABS: 100.0000 mg | ORAL_TABLET | Freq: Every day | ORAL | Status: DC

## 2017-07-03 MED ORDER — TOPIRAMATE 200 MG PO TABS: 200.0000 mg | ORAL_TABLET | Freq: Every day | ORAL | 1 refills | Status: DC

## 2017-07-03 MED ORDER — TOPIRAMATE 100 MG PO TABS: 200.0000 mg | ORAL_TABLET | Freq: Every day | ORAL | Status: DC

## 2017-07-03 NOTE — Progress Notes (Signed)
Recreation Therapy Notes  Date: 01.22.2019  Time: 1:00 PM   Location: Craft Room  Behavioral response: Appropriate   Intervention Topic: Creative Expressions  Discussion/Intervention: Group content on today was focused on creative expressions. The group defined creative expressions and ways they use creative expressions. Individual identified other positive ways creative expressions can be used and why it is important to express yourself. Patients participated in the intervention "butterfly origami", where they had a chance to creatively express themselves. Clinical Observations/Feedback:  Patient came to group and defined creative expressions as expressing self in a creative way. Individual identified painting and music as ways she creatively expresses herself. Individual participated in the intervention and was social with peers and staff during group.  Branden Shallenberger LRT/CTRS         Chanya Chrisley 07/03/2017 2:19 PM

## 2017-07-03 NOTE — Progress Notes (Signed)
Patient voiced understanding of discharge instructions, no signs of distress, denies Si/hi or avh, states she feels better and wants to move on in life, all belongings given back to  Patient and prescriptions given to Patient and home medications from the pharmacy. Patient's family member transferred her back home.

## 2017-07-03 NOTE — Progress Notes (Signed)
Patient ID: Megan MessingKimberly K Ackert, female   DOB: [birth year], 42 y.o.   MRN: [id number] Anxious, "I am going home but it seems like the time is not moving, I don't know why I am excited, I am worried I won't be able to get a job; I still have to pay restitution and probation fee to the court. I will be out of luck if I don't get a job; I am not a criminal anymore; I committed Credit Card Fraud 4 years ago, I am still paying for it...." Hydroxyzine 50 mg PO PRN, Anxiety, given.

## 2017-07-03 NOTE — Plan of Care (Signed)
Patient slept for Estimated Hours of 6.15; Precautionary checks every 15 minutes for safety maintained, room free of safety hazards, patient sustains no injury or falls during this shift.  

## 2017-07-03 NOTE — Progress Notes (Signed)
Patient is oriented, smiling, but a little anxious, anticipating going home today, she is compliant, no signs of distress, takes all medications, will continue to monitor and camera surveillance in progress for safety. Patient denies Si/hi or avh.

## 2017-07-03 NOTE — Progress Notes (Signed)
Recreation Therapy Notes  INPATIENT RECREATION TR PLAN  Patient Details Name: SARABELLE GENSON MRN: [id number] DOB: 1975-12-18 Today's Date: 07/03/2017  Rec Therapy Plan Is patient appropriate for Therapeutic Recreation?: Yes Treatment times per week: at least 3 Estimated Length of Stay: 5-7 days TR Treatment/Interventions: Group participation (Comment)  Discharge Criteria Pt will be discharged from therapy if:: Discharged Treatment plan/goals/alternatives discussed and agreed upon by:: Patient/family  Discharge Summary Short term goals set: Patient will participate actively in groups and activities x5 days.  Short term goals met: Adequate for discharge Progress toward goals comments: Groups attended Which groups?: Goal setting Reason goals not met: N/A Therapeutic equipment acquired: N/A Reason patient discharged from therapy: Discharge from hospital Pt/family agrees with progress & goals achieved: Yes Date patient discharged from therapy: 07/03/17   Tereso Unangst 07/03/2017, 4:45 PM

## 2017-07-03 NOTE — Progress Notes (Signed)
Patient ID: Megan Mejia, female   DOB: [birth year], 42 y.o.   MRN: [id number] Visible, pleasant, c/o 4/10 awakened from sleep HA, received Fioricet 1 tab, denied SI/HI/AVH, anticipating discharge during the next shift.

## 2017-07-03 NOTE — Discharge Summary (Addendum)
Physician Discharge Summary Note  Patient:  Megan Mejia is an 42 y.o., female MRN:  [id number] DOB:  1976-01-16 Patient phone:  [phone] (home)  Patient address:   [address] [place] [address],  Total Time spent with patient: 30 minutes  Date of Admission:  06/29/2017 Date of Discharge: 07/03/2017  Reason for Admission:  Psychotic break  History of Present Illness:  "[name] had a fight, my medicine is not working" Pt admited from ED. 42 year old woman with a history of psychotic symptoms who came back voluntarily to the hospital. Pt  was inpatinet last month from 05/29/17- 06/08/17 for psychosis, was discharged on Risperdal and effexor. Per consult note, patient with a history of psychotic and mood symptoms, came to the emergency room ,   she does not think her medicines are working, and psychotic.  she is still hearing voices and believes that the television is talking with her. Feels nervous and depressed most of the time. Cannot sleep very well. Feels agitated and frightening. She claims that she has been compliant with her Risperdal and Effexor. She tells me she ran out of the Vistaril. Asked me several times to be back on Topamax. Admits that she is using marijuana daily. Drinks some alcohol but minimizes it. She has had some passive suicidal thoughts without intent. No homicidal ideation. Pt very anxious and guarded, admits hearing voices and TV talking to her, believes risperdal making her worse, states she will not take it, wants sth that does not cause wt gain and sedation, agrees to start geodon, willing to continue to continue other meds, asked to continue topamax for migraine. Endorses depression, anxiety, poor sleep. Denies SI/HI. Admits using THC daily. UDS+ ve for THC, alcohol level less than 10.    Associated Signs/Symptoms: Depression Symptoms:  depressed mood, insomnia, anxiety, (Hypo) Manic Symptoms:  Irritable Mood, Anxiety Symptoms:   Excessive Worry, Psychotic Symptoms:  Delusions, Hallucinations: Auditory PTSD Symptoms:  Past Psychiatric History: depression, substance abuse, anxiety. Patient has a history of positive past admissions to the hospital. Past suicidal ideation. Has been on several prior medications. Last time she was here just a few weeks ago it was described that she was doing rather well on the risperidone.  Family Psychiatric  History: unknown  Social history. Lives with the boyfriend. Uninsured.  Principal Problem: Severe recurrent major depressive disorder with psychotic features [facility]) Discharge Diagnoses: Patient Active Problem List   Diagnosis Date Noted  . Severe recurrent major depressive disorder with psychotic features (HCC) [F33.3] 06/29/2017    Priority: High  . Cannabis use disorder, moderate, dependence (HCC) [F12.20] 06/29/2017  . Tobacco use disorder [F17.200] 06/29/2017  . 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 Anxiety state [F41.1] 05/30/2017   Past Medical History:  Past Medical History:  Diagnosis Date  . Anxiety   . Depression   . High cholesterol   . Hypertension     Past Surgical History:  Procedure Laterality Date  . TONSILLECTOMY     Family History: History reviewed. No pertinent family history.  Social History:  Social History   Substance and Sexual Activity  Alcohol Use Yes   Comment: every day     Social History   Substance and Sexual Activity  Drug Use Yes  . Types: Marijuana    Social History   Socioeconomic History  . Marital status: Divorced    Spouse name: None  . Number of children: None  . Years of education: None  .  Highest education level: None  Social Needs  . Financial resource strain: None  . Food insecurity - worry: None  . Food insecurity - inability: None  . Transportation needs - medical: None  . Transportation needs - non-medical: None  Occupational History  . None  Tobacco Use  . Smoking status: Current  Every Day Smoker    Packs/day: 1.00    Types: Cigarettes  . Smokeless tobacco: Never Used  Substance and Sexual Activity  . Alcohol use: Yes    Comment: every day  . Drug use: Yes    Types: Marijuana  . Sexual activity: Not Currently  Other Topics Concern  . None  Social History Narrative  . None    Hospital Course:    [name] is a 42 year old female with a history of depression, anxiety and psychosis admitted for worsening of her symptoms. She believes risperdal making her worse.   #Mood and psychosis, improved -continue Effexor 150 mg in AM and 75 mg in PM -continue Geodon 40 mg BID -continue Trazodone 100 mg nightly -continue Topamax 100 mg BID -continue Vistaril 50 mg TID PRN  #Migraine -Topamax for headache prevention -Fioricet PRN  #Metabolic syndrome monitoring -Lipid panel, TSH, EKG and HgbA1C were done recently  #Cannabis abuse -minimizes problems and declines treatment  #Smoking cessation -Nicotine patch is available  #Disposition -discharge to home -follow up with [facility]   Physical Findings: AIMS: Facial and Oral Movements Muscles of Facial Expression: None, normal Lips and Perioral Area: None, normal Jaw: None, normal Tongue: None, normal,Extremity Movements Upper (arms, wrists, hands, fingers): None, normal Lower (legs, knees, ankles, toes): None, normal, Trunk Movements Neck, shoulders, hips: None, normal, Overall Severity Severity of abnormal movements (highest score from questions above): None, normal Incapacitation due to abnormal movements: None, normal Patient's awareness of abnormal movements (rate only patient's report): No Awareness, Dental Status Current problems with teeth and/or dentures?: No(denies) Does patient usually wear dentures?: Yes  CIWA:  CIWA-Ar Total: 0 COWS:  COWS Total Score: 0  Musculoskeletal: Strength & Muscle Tone: within normal limits Gait & Station: normal Patient leans: N/A  Psychiatric Specialty  Exam: Physical Exam  Nursing note and vitals reviewed. Psychiatric: She has a normal mood and affect. Her speech is normal and behavior is normal. Judgment and thought content normal. Cognition and memory are normal.    Review of Systems  Neurological: Positive for headaches.  Psychiatric/Behavioral: Positive for substance abuse.  All other systems reviewed and are negative.   Blood pressure 114/75, pulse 74, temperature 98.3 F (36.8 C), temperature source Oral, resp. rate 16, height 5' (1.524 m), weight 70.3 kg (155 lb), SpO2 100 %.Body mass index is 30.27 kg/m.  General Appearance: Casual  Eye Contact:  Good  Speech:  Clear and Coherent  Volume:  Normal  Mood:  Euthymic  Affect:  Appropriate  Thought Process:  Goal Directed and Descriptions of Associations: Intact  Orientation:  Full (Time, Place, and Person)  Thought Content:  WDL  Suicidal Thoughts:  No  Homicidal Thoughts:  No  Memory:  Immediate;   Fair Recent;   Fair Remote;   Fair  Judgement:  Fair  Insight:  Fair  Psychomotor Activity:  Normal  Concentration:  Concentration: Fair and Attention Span: Fair  Recall:  [employer] of Knowledge:  Fair  Language:  Fair  Akathisia:  No  Handed:  Right  AIMS (if indicated):     Assets:  Communication Skills Desire for Improvement Housing Intimacy Physical Health Resilience  Social Support  ADL's:  Intact  Cognition:  WNL  Sleep:  Number of Hours: 6.15     Have you used any form of tobacco in the last 30 days? (Cigarettes, Smokeless Tobacco, Cigars, and/or Pipes): Yes  Has this patient used any form of tobacco in the last 30 days? (Cigarettes, Smokeless Tobacco, Cigars, and/or Pipes) Yes, Yes, A prescription for an FDA-approved tobacco cessation medication was offered at discharge and the patient refused  Blood Alcohol level:  Lab Results  Component Value Date   [facility] <10 06/28/2017   ETH <10 05/27/2017    Metabolic Disorder Labs:  Lab Results  Component  Value Date   HGBA1C 5.6 05/30/2017   MPG 114.02 05/30/2017   No results found for: PROLACTIN Lab Results  Component Value Date   CHOL 227 (H) 05/30/2017   TRIG 256 (H) 05/30/2017   HDL 33 (L) 05/30/2017   CHOLHDL 6.9 05/30/2017   VLDL 51 (H) 05/30/2017   LDLCALC 143 (H) 05/30/2017   LDLCALC 112 (H) 12/27/2011    See Psychiatric Specialty Exam and Suicide Risk Assessment completed by Attending Physician prior to discharge.  Discharge destination:  Home  Is patient on multiple antipsychotic therapies at discharge:  No   Has Patient had three or more failed trials of antipsychotic monotherapy by history:  No  Recommended Plan for Multiple Antipsychotic Therapies: NA  Discharge Instructions    Diet - low sodium heart healthy   Complete by:  As directed    Increase activity slowly   Complete by:  As directed      Allergies as of 07/03/2017   No Known Allergies     Medication List    STOP taking these medications   risperiDONE 2 MG tablet Commonly known as:  RISPERDAL     TAKE these medications     Indication  butalbital-acetaminophen-caffeine 50-325-40 MG tablet Commonly known as:  FIORICET, ESGIC Take 1 tablet by mouth every 6 (six) hours as needed for headache.  Indication:  Migraine Headache   hydrOXYzine 50 MG tablet Commonly known as:  ATARAX/VISTARIL Take 1 tablet (50 mg total) by mouth every 6 (six) hours as needed for anxiety. What changed:    medication strength  how much to take  when to take this  Indication:  Feeling Anxious   topiramate 100 MG tablet Commonly known as:  TOPAMAX Take 1 tablet (100 mg total) by mouth 2 (two) times daily.  Indication:  Migraine Headache   topiramate 200 MG tablet Commonly known as:  TOPAMAX Take 1 tablet (200 mg total) by mouth daily.  Indication:  Migraine Headache   topiramate 100 MG tablet Commonly known as:  TOPAMAX Take 1 tablet (100 mg total) by mouth at bedtime. Start taking on:  07/04/2017   Indication:  Migraine Headache   traZODone 150 MG tablet Commonly known as:  DESYREL Take 1 tablet (150 mg total) by mouth at bedtime as needed for sleep.  Indication:  Trouble Sleeping   venlafaxine 75 MG tablet Commonly known as:  EFFEXOR Take 2 tablets (150 mg total) by mouth daily before breakfast. And 1 tab at 17:00 What changed:    how much to take  when to take this  additional instructions  Indication:  Major Depressive Disorder   ziprasidone 40 MG capsule Commonly known as:  GEODON Take 1 capsule (40 mg total) by mouth 2 (two) times daily with a meal.  Indication:  Manic-Depression      Follow-up Information  [facility] Go on 07/04/2017.   Why:  7:15am for Hospital Follow up.  Meet Mr. Unk PintoHarvey Bryant there for Peer Services. Contact information: [address] [place] [address] [phone]           Follow-up recommendations:  Activity:  as tolerated Diet:  low sodium heart healthy Other:  keep follow up appointments  Comments:    Signed: Kristine LineaJolanta Aira Sallade, MD 07/03/2017, 11:29 AM

## 2017-07-03 NOTE — [facility] Group Notes (Signed)
[facility] Group Notes:  (Nursing/MHT/Case Management/Adjunct)  Date:  07/03/2017  Time:  2:33 PM  Type of Therapy:  Psychoeducational Skills  Participation Level:  Active  Participation Quality:  Attentive and Sharing  Affect:  Appropriate  Cognitive:  Alert and Appropriate  Insight:  Appropriate and Good  Engagement in Group:  Engaged  Modes of Intervention:  Education  Summary of Progress/Problems:  Sebastian AcheJasmine  Rogue Pautler 07/03/2017, 2:33 PM

## 2017-07-03 NOTE — [facility] Suicide Risk Assessment (Signed)
[facility]   Principal Problem: Severe recurrent major depressive disorder with psychotic features [facility](HCC) Discharge Diagnoses:  Patient Active Problem List   Diagnosis Date Noted  . Severe recurrent major depressive disorder with psychotic features (HCC) [F33.3] 06/29/2017    Priority: High  . Cannabis use disorder, moderate, dependence (HCC) [F12.20] 06/29/2017  . Tobacco use disorder [F17.200] 06/29/2017  . 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 Anxiety state [F41.1] 05/30/2017    Total Time spent with patient: 30 minutes  Musculoskeletal: Strength & Muscle Tone: within normal limits Gait & Station: normal Patient leans: N/A  Psychiatric Specialty Exam: Review of Systems  Neurological: Positive for headaches.  Psychiatric/Behavioral: Positive for substance abuse.  All other systems reviewed and are negative.   Blood pressure 124/82, pulse 81, temperature 98.2 F (36.8 C), temperature source Oral, resp. rate 16, height 5' (1.524 m), weight 70.3 kg (155 lb), SpO2 100 %.Body mass index is 30.27 kg/m.  General Appearance: Casual  Eye Contact::  Good  Speech:  Clear and Coherent409  Volume:  Normal  Mood:  Euthymic  Affect:  Appropriate  Thought Process:  Goal Directed and Descriptions of Associations: Intact  Orientation:  Full (Time, Place, and Person)  Thought Content:  WDL  Suicidal Thoughts:  No  Homicidal Thoughts:  No  Memory:  Immediate;   Fair Recent;   Fair Remote;   Fair  Judgement:  Fair  Insight:  Fair  Psychomotor Activity:  Normal  Concentration:  Fair  Recall:  [employer]  Fund of Knowledge:Fair  Language: Fair  Akathisia:  No  Handed:  Right  AIMS (if indicated):     Assets:  Communication Skills Desire for Improvement Housing Physical Health Resilience Social Support  Sleep:     Cognition: WNL  ADL's:  Intact   Mental Status Per Nursing Assessment::   On Admission:  NA  Demographic Factors:   Caucasian  Loss Factors: NA  Historical Factors: Prior suicide attempts, Family history of mental illness or substance abuse and Impulsivity  Risk Reduction Factors:   Sense of responsibility to family, Living with another person, especially a relative and Positive social support  Continued Clinical Symptoms:  Depression:   Comorbid alcohol abuse/dependence Impulsivity Alcohol/Substance Abuse/Dependencies  Cognitive Features That Contribute To Risk:  None    Suicide Risk:  Minimal: No identifiable suicidal ideation.  Patients presenting with no risk factors but with morbid ruminations; may be classified as minimal risk based on the severity of the depressive symptoms  Follow-up Information    [facility] Follow up.   Why:  TBD Contact information: [address] [place] [address] [phone]           Plan Of Care/Follow-up recommendations:  Activity:  as tolerated Diet:  low sodium heart healthy Other:  keep follow up appointments  Kristine LineaJolanta Yehya Brendle, MD 07/03/2017, 5:45 AM

## 2017-07-03 NOTE — [facility] Group Notes (Signed)
07/03/2017 0930  Type of Therapy/Topic:  Group Therapy:  Feelings about Diagnosis  Participation Level:  Active   Description of Group:   This group will allow patients to explore their thoughts and feelings about diagnoses they have received. Patients will be guided to explore their level of understanding and acceptance of these diagnoses. Facilitator will encourage patients to process their thoughts and feelings about the reactions of others to their diagnosis and will guide patients in identifying ways to discuss their diagnosis with significant others in their lives. This group will be process-oriented, with patients participating in exploration of their own experiences, giving and receiving support, and processing challenge from other group members.   Therapeutic Goals: 1. Patient will demonstrate understanding of diagnosis as evidenced by identifying two or more symptoms of the disorder 2. Patient will be able to express two feelings regarding the diagnosis 3. Patient will demonstrate their ability to communicate their needs through discussion and/or role play  Summary of Patient Progress: Pt continues to work towards their tx goals but has not yet reached them. Pt was able to appropriately participate in group discussion, and was able to offer support/validation to other group members. Pt reports feeling, "awesome today. I'm going home and I'm excited." Pt reported agreeing with her diagnosis, "people used to tell me before I was even diagnosed. They would bring up my mood swings and stuff." Pt reported feeling there is a stigma attached to having a mental health diagnosis. Pt stated, "My ex-fiance would tell me to go take my meds. It made me feel horrible."   Therapeutic Modalities:   Cognitive Behavioral Therapy Brief Therapy  Heidi DachKelsey Sharma Lawrance, MSW, LCSW 07/03/2017 10:22 AM

## 2017-08-15 ENCOUNTER — Ambulatory Visit: Payer: Self-pay | Admitting: Pharmacy Technician

## 2017-08-16 NOTE — Progress Notes (Signed)
Patient scheduled for eligibility appointment at [facility].  Patient did not show for the appointment on August 15, 2017 at 2:00p.m.  Patient did not reschedule eligibility appointment.  [facility] unable to provide additional medication assistance until eligibility is determined.  Sherilyn DacostaBetty J. Kluttz Care Manager [facility]

## 2018-02-20 ENCOUNTER — Other Ambulatory Visit: Payer: Self-pay

## 2018-02-20 ENCOUNTER — Emergency Department
Admission: EM | Admit: 2018-02-20 | Discharge: 2018-02-20 | Disposition: A | Discharge status: Home / Self Care | Payer: Self-pay | Attending: Emergency Medicine | Admitting: Emergency Medicine

## 2018-02-20 DIAGNOSIS — I10 Essential (primary) hypertension: Secondary | ICD-10-CM | POA: Insufficient documentation

## 2018-02-20 DIAGNOSIS — Z79899 Other long term (current) drug therapy: Secondary | ICD-10-CM | POA: Insufficient documentation

## 2018-02-20 DIAGNOSIS — L0211 Cutaneous abscess of neck: Secondary | ICD-10-CM | POA: Insufficient documentation

## 2018-02-20 DIAGNOSIS — Z760 Encounter for issue of repeat prescription: Secondary | ICD-10-CM | POA: Insufficient documentation

## 2018-02-20 DIAGNOSIS — F17210 Nicotine dependence, cigarettes, uncomplicated: Secondary | ICD-10-CM | POA: Insufficient documentation

## 2018-02-20 MED ORDER — LISINOPRIL 20 MG PO TABS: 20.0000 mg | ORAL_TABLET | Freq: Every day | ORAL | 0 refills | Status: DC

## 2018-02-20 MED ORDER — SULFAMETHOXAZOLE-TRIMETHOPRIM 800-160 MG PO TABS: 1.0000 | ORAL_TABLET | Freq: Two times a day (BID) | ORAL | 0 refills | Status: DC

## 2018-02-20 MED ORDER — HYDROCODONE-ACETAMINOPHEN 5-325 MG PO TABS: 1.0000 | ORAL_TABLET | ORAL | 0 refills | Status: DC | PRN

## 2018-02-20 NOTE — ED Triage Notes (Signed)
Pt has abscess to left side of neck for the last 2 days - the area is red/swollen but no drainage noted

## 2018-02-20 NOTE — Discharge Instructions (Signed)
Follow-up with the surgeon listed on your discharge papers if any continued problems or you may return to the emergency department for drainage.  This will not remove the cyst which is what the surgeon would do.  Use warm compresses frequently to the area.  Begin taking antibiotic twice a day for the next 10 days.  Norco is for pain as needed.  Do not take this medication drive or operate machinery as it could cause injury.

## 2018-02-20 NOTE — ED Provider Notes (Signed)
[facility] Emergency Department Provider Note  ____________________________________________   First MD Initiated Contact with Patient 02/20/18 1007     (approximate)  I have reviewed the triage vital signs and the nursing notes.   HISTORY  Chief Complaint Abscess   HPI Megan Mejia is a 42 y.o. female presents to the emergency department with complaint of possible abscess to the left side of her neck.  Patient states that there is been a cyst there for several years but in the last 2 days this area has become red and swollen.  She has been using ointment to the area without any relief.  She denies any fever, chills, nausea or vomiting.  She denies any previous abscess problems.  She rates her pain as a 5/10   Past Medical History:  Diagnosis Date  . Anxiety   . Depression   . High cholesterol   . Hypertension     Patient Active Problem List   Diagnosis Date Noted  . Severe recurrent major depressive disorder with psychotic features (HCC) 06/29/2017  . Cannabis use disorder, moderate, dependence (HCC) 06/29/2017  . Tobacco use disorder 06/29/2017  . Brief psychotic disorder (HCC) 06/08/2017  . Anxiety state 05/30/2017    Past Surgical History:  Procedure Laterality Date  . TONSILLECTOMY      Prior to Admission medications   Medication Sig Start Date End Date Taking? Authorizing Provider  HYDROcodone-acetaminophen (NORCO/VICODIN) 5-325 MG tablet Take 1 tablet by mouth every 4 (four) hours as needed for moderate pain. 02/20/18   Tommi Rumps, PA-C  lisinopril (PRINIVIL,ZESTRIL) 20 MG tablet Take 1 tablet (20 mg total) by mouth daily. 02/20/18   Tommi Rumps, PA-C  sulfamethoxazole-trimethoprim (BACTRIM DS,SEPTRA DS) 800-160 MG tablet Take 1 tablet by mouth 2 (two) times daily. 02/20/18   Tommi Rumps, PA-C  venlafaxine (EFFEXOR) 75 MG tablet Take 2 tablets (150 mg total) by mouth daily before breakfast. And 1 tab at 17:00  07/03/17   Pucilowska, Jolanta B, MD    Allergies Patient has no known allergies.  No family history on file.  Social History Social History   Tobacco Use  . Smoking status: Current Every Day Smoker    Packs/day: 1.00    Types: Cigarettes  . Smokeless tobacco: Never Used  Substance Use Topics  . Alcohol use: Yes    Comment: 3-4 times a week  . Drug use: Not Currently    Types: Marijuana    Review of Systems Constitutional: No fever/chills Cardiovascular: Denies chest pain. Respiratory: Denies shortness of breath. Musculoskeletal: Negative for back pain. Skin: Positive for superficial sebaceous cyst. Neurological: Negative for headaches, focal weakness or numbness. ___________________________________________   PHYSICAL EXAM:  VITAL SIGNS: ED Triage Vitals  Enc Vitals Group     BP 02/20/18 0958 (!) 165/94     Pulse Rate 02/20/18 0958 (!) 103     Resp --      Temp 02/20/18 0958 99.7 F (37.6 C)     Temp Source 02/20/18 0958 Oral     SpO2 02/20/18 0958 97 %     Weight 02/20/18 0959 170 lb (77.1 kg)     Height 02/20/18 0959 5\' 1"  (1.549 m)     Head Circumference --      Peak Flow --      Pain Score 02/20/18 1000 5     Pain Loc --      Pain Edu? --      Excl. in GC? --  Constitutional: Alert and oriented. Well appearing and in no acute distress. Eyes: Conjunctivae are normal.  Head: Atraumatic. Neck: No stridor.  Nontender cervical spine to palpation posteriorly.  The left lateral skin of the neck reveals a erythematous, firm and tender to centimeter nodule.  There is some minimal erythema surrounding this area.  No drainage noted. Hematological/Lymphatic/Immunilogical: No cervical lymphadenopathy. Cardiovascular: Normal rate, regular rhythm. Grossly normal heart sounds.  Good peripheral circulation. Respiratory: Normal respiratory effort.  No retractions. Lungs CTAB. Musculoskeletal: Moves upper and lower extremities without any difficulty.  Normal gait was  noted. Neurologic:  Normal speech and language. No gross focal neurologic deficits are appreciated.  Skin:  Skin is warm, dry and intact.  Abscess left lateral soft tissue of the neck as noted above. Psychiatric: Mood and affect are normal. Speech and behavior are normal.  ____________________________________________   LABS (all labs ordered are listed, but only abnormal results are displayed)  Labs Reviewed - No data to display   PROCEDURES  Procedure(s) performed: None  Procedures  Critical Care performed: No  ____________________________________________   INITIAL IMPRESSION / ASSESSMENT AND PLAN / ED COURSE  As part of my medical decision making, I reviewed the following data within the electronic MEDICAL RECORD NUMBER Notes from prior ED visits and [place] Controlled Substance Database  42 year old female presents to the ED with complaint of abscess to the left side of her neck.  She states she has had a cyst there for many years which is never been red.  In the last 2 days there is been redness.  She has not been using any over-the-counter medication or warm compresses to the area.  There is been no drainage, fever or chills.  Patient was given a prescription for Bactrim DS twice daily for 10 days along with Norco as needed for pain.  Patient also requested a refill of her lisinopril and states that she has an appointment with her doctor within 30 days.  She is to begin using warm compresses to the area frequently and follow-up with the surgeon listed on her discharge papers or return to the ED for incision and drainage if area does not open and drain on its own. ____________________________________________   FINAL CLINICAL IMPRESSION(S) / ED DIAGNOSES  Final diagnoses:  Abscess of skin of neck     ED Discharge Orders         Ordered    sulfamethoxazole-trimethoprim (BACTRIM DS,SEPTRA DS) 800-160 MG tablet  2 times daily     02/20/18 1037    HYDROcodone-acetaminophen  (NORCO/VICODIN) 5-325 MG tablet  Every 4 hours PRN     02/20/18 1037    lisinopril (PRINIVIL,ZESTRIL) 20 MG tablet  Daily     02/20/18 1049           Note:  This document was prepared using Dragon voice recognition software and may include unintentional dictation errors.    Tommi Rumps, PA-C 02/20/18 1354    Emily Filbert, MD 02/20/18 [phone]

## 2018-02-20 NOTE — ED Notes (Signed)
See triage note  Presents with possible abscess area to left side of neck  States area became more red and swollen over the past 2 days

## 2018-12-16 ENCOUNTER — Emergency Department
Admission: EM | Admit: 2018-12-16 | Discharge: 2018-12-16 | Disposition: A | Discharge status: Home / Self Care | Payer: Self-pay | Attending: Emergency Medicine | Admitting: Emergency Medicine

## 2018-12-16 ENCOUNTER — Other Ambulatory Visit: Payer: Self-pay

## 2018-12-16 ENCOUNTER — Encounter: Payer: Self-pay | Admitting: Emergency Medicine

## 2018-12-16 ENCOUNTER — Emergency Department: Payer: Self-pay

## 2018-12-16 DIAGNOSIS — R0789 Other chest pain: Secondary | ICD-10-CM

## 2018-12-16 DIAGNOSIS — F17210 Nicotine dependence, cigarettes, uncomplicated: Secondary | ICD-10-CM | POA: Insufficient documentation

## 2018-12-16 DIAGNOSIS — Z79899 Other long term (current) drug therapy: Secondary | ICD-10-CM | POA: Insufficient documentation

## 2018-12-16 DIAGNOSIS — I10 Essential (primary) hypertension: Secondary | ICD-10-CM | POA: Insufficient documentation

## 2018-12-16 DIAGNOSIS — F419 Anxiety disorder, unspecified: Secondary | ICD-10-CM | POA: Insufficient documentation

## 2018-12-16 LAB — CBC
HCT: 29.5 % — ABNORMAL LOW (ref 36.0–46.0)
Hemoglobin: 8.6 g/dL — ABNORMAL LOW (ref 12.0–15.0)
MCH: 20.9 pg — ABNORMAL LOW (ref 26.0–34.0)
MCHC: 29.2 g/dL — ABNORMAL LOW (ref 30.0–36.0)
MCV: 71.8 fL — ABNORMAL LOW (ref 80.0–100.0)
Platelets: 623 10*3/uL — ABNORMAL HIGH (ref 150–400)
RBC: 4.11 MIL/uL (ref 3.87–5.11)
RDW: 17.2 % — ABNORMAL HIGH (ref 11.5–15.5)
WBC: 9.8 10*3/uL (ref 4.0–10.5)
nRBC: 0 % (ref 0.0–0.2)

## 2018-12-16 LAB — BASIC METABOLIC PANEL
Anion gap: 10 (ref 5–15)
BUN: 9 mg/dL (ref 6–20)
CO2: 21 mmol/L — ABNORMAL LOW (ref 22–32)
Calcium: 8.3 mg/dL — ABNORMAL LOW (ref 8.9–10.3)
Chloride: 106 mmol/L (ref 98–111)
Creatinine, Ser: 0.54 mg/dL (ref 0.44–1.00)
GFR calc Af Amer: >60 mL/min (ref >60)
GFR calc non Af Amer: >60 mL/min (ref >60)
Glucose, Bld: 110 mg/dL — ABNORMAL HIGH (ref 70–99)
Potassium: 3.2 mmol/L — ABNORMAL LOW (ref 3.5–5.1)
Sodium: 137 mmol/L (ref 135–145)

## 2018-12-16 LAB — MAGNESIUM: Magnesium: 2 mg/dL (ref 1.7–2.4)

## 2018-12-16 LAB — TROPONIN I (HIGH SENSITIVITY)
Troponin I (High Sensitivity): 3 ng/L (ref <18)
Troponin I (High Sensitivity): <2 ng/L (ref <18)

## 2018-12-16 MED ORDER — POTASSIUM CHLORIDE ER 10 MEQ PO TBCR: 10.0000 meq | EXTENDED_RELEASE_TABLET | Freq: Every day | ORAL | 0 refills | Status: AC

## 2018-12-16 MED ORDER — LISINOPRIL 20 MG PO TABS: 20.0000 mg | ORAL_TABLET | Freq: Every day | ORAL | 0 refills | Status: AC

## 2018-12-16 MED ORDER — SODIUM CHLORIDE 0.9 % IV BOLUS: 250.0000 mL | Freq: Once | INTRAVENOUS | Status: DC

## 2018-12-16 MED ORDER — POTASSIUM CHLORIDE CRYS ER 20 MEQ PO TBCR
40.0000 meq | EXTENDED_RELEASE_TABLET | Freq: Once | ORAL | Status: AC
  Administered 2018-12-16: 40 meq via ORAL
  Filled 2018-12-16: qty 2

## 2018-12-16 MED ORDER — FERROUS GLUCONATE 324 (38 FE) MG PO TABS: 324.0000 mg | ORAL_TABLET | Freq: Every day | ORAL | 0 refills | Status: AC

## 2018-12-16 MED ORDER — LORAZEPAM 0.5 MG PO TABS
0.5000 mg | ORAL_TABLET | Freq: Once | ORAL | Status: AC
  Administered 2018-12-16: 0.5 mg via ORAL
  Filled 2018-12-16: qty 1

## 2018-12-16 MED ORDER — LORAZEPAM 0.5 MG PO TABS: 0.5000 mg | ORAL_TABLET | Freq: Three times a day (TID) | ORAL | 0 refills | Status: AC | PRN

## 2018-12-16 MED ORDER — LISINOPRIL 10 MG PO TABS
20.0000 mg | ORAL_TABLET | Freq: Once | ORAL | Status: AC
  Administered 2018-12-16: 20 mg via ORAL
  Filled 2018-12-16: qty 2

## 2018-12-16 MED ORDER — POTASSIUM CHLORIDE 10 MEQ/100ML IV SOLN
10.0000 meq | Freq: Once | INTRAVENOUS | Status: DC
  Filled 2018-12-16: qty 100

## 2018-12-16 NOTE — ED Triage Notes (Signed)
Patient presents to the ED with intermittent chest pain since 6am.  Patient states, "I feel like my heart is racing and fluttering and it's tightness in my chest."  Patient reports pain radiating to right arm.  Patient states chest pain woke her up after she went to sleep today after working 3rd shift.

## 2018-12-16 NOTE — ED Provider Notes (Addendum)
[facility] Emergency Department Provider Note   ____________________________________________   First MD Initiated Contact with Patient 12/16/18 1951     (approximate)  I have reviewed the triage vital signs and the nursing notes.   HISTORY  Chief Complaint Chest Pain    HPI Megan Mejia is a 43 y.o. female here for evaluation of chest discomfort  Patient reports that since about 6 AM this morning when she left work she is been having sense of discomfort in her chest.  She reports she thinks it might be from anxiety she has had similar in the past and she is been under a lot of stress in her life recently.  She is currently working, reports that the pain did not come while working.  This discomfort comes and goes nothing seems to make it better or worse.  She takes [facility] powder daily with aspirin and it, but reports not because of chest pain.  She is not had any shortness of breath.  No leg swelling.  She denies pregnancy reports previous tubal ligation  She does have a history of low iron, and she denies any black or bloody stools she is noticed any problems with bruising or bleeding.  Patient reports presently just a light sense of discomfort across the chest.  Does not radiate.  Is not described as a pressure rather she describes it as to feeling like a stress  sometimes it feels like slight flutter year other feeling  Slight radiation of the discomfort towards her right arm  Past Medical History:  Diagnosis Date   Anxiety    Depression    High cholesterol    Hypertension     Patient Active Problem List   Diagnosis Date Noted   Severe recurrent major depressive disorder with psychotic features (HCC) 06/29/2017   Cannabis use disorder, moderate, dependence (HCC) 06/29/2017   Tobacco use disorder 06/29/2017   Brief psychotic disorder (HCC) 06/08/2017   Anxiety state 05/30/2017    Past Surgical History:  Procedure Laterality Date    TONSILLECTOMY      Prior to Admission medications   Medication Sig Start Date End Date Taking? Authorizing Provider  lisinopril (ZESTRIL) 20 MG tablet Take 1 tablet (20 mg total) by mouth daily. 12/16/18  Yes Sharyn CreamerQuale, Doneen Ollinger, MD  venlafaxine (EFFEXOR) 75 MG tablet Take 2 tablets (150 mg total) by mouth daily before breakfast. And 1 tab at 17:00 07/03/17  Yes Pucilowska, Jolanta B, MD  ferrous gluconate (FERGON) 324 MG tablet Take 1 tablet (324 mg total) by mouth daily with breakfast. 12/16/18   Sharyn CreamerQuale, Quantez Schnyder, MD  HYDROcodone-acetaminophen (NORCO/VICODIN) 5-325 MG tablet Take 1 tablet by mouth every 4 (four) hours as needed for moderate pain. Patient not taking: Reported on 12/16/2018 02/20/18   Tommi RumpsSummers, Rhonda L, PA-C  LORazepam (ATIVAN) 0.5 MG tablet Take 1 tablet (0.5 mg total) by mouth every 8 (eight) hours as needed for anxiety. 12/16/18 12/16/19  Sharyn CreamerQuale, Renne Cornick, MD  potassium chloride (K-DUR) 10 MEQ tablet Take 1 tablet (10 mEq total) by mouth daily. 12/16/18   Sharyn CreamerQuale, Gennett Garcia, MD  sulfamethoxazole-trimethoprim (BACTRIM DS,SEPTRA DS) 800-160 MG tablet Take 1 tablet by mouth 2 (two) times daily. Patient not taking: Reported on 12/16/2018 02/20/18   Tommi RumpsSummers, Rhonda L, PA-C    Allergies Patient has no known allergies.  No family history on file.  Social History Social History   Tobacco Use   Smoking status: Current Every Day Smoker    Packs/day: 1.00  Types: Cigarettes   Smokeless tobacco: Never Used  Substance Use Topics   Alcohol use: Yes    Comment: 3-4 times a week   Drug use: Not Currently    Types: Marijuana    Review of Systems Constitutional: No fever/chills Eyes: No visual changes. ENT: No sore throat. Cardiovascular: Denies chest pain.  Reports rhythm feels like a racing heart tightening discomfort to mild Respiratory: Denies shortness of breath. Gastrointestinal: No abdominal pain.   Genitourinary: Negative for dysuria. Musculoskeletal: Negative for back pain. Skin: Negative for  rash. Neurological: Negative for headaches, areas of focal weakness or numbness.    ____________________________________________   PHYSICAL EXAM:  VITAL SIGNS: ED Triage Vitals  Enc Vitals Group     BP 12/16/18 1609 (!) 160/90     Pulse Rate 12/16/18 1609 93     Resp 12/16/18 1609 16     Temp 12/16/18 1609 98.4 F (36.9 C)     Temp Source 12/16/18 1609 Oral     SpO2 12/16/18 1609 99 %     Weight 12/16/18 1610 180 lb (81.6 kg)     Height 12/16/18 1610 5' (1.524 m)     Head Circumference --      Peak Flow --      Pain Score 12/16/18 1610 7     Pain Loc --      Pain Edu? --      Excl. in GC? --     Constitutional: Alert and oriented. Well appearing and in no acute distress. Eyes: Conjunctivae are normal. Head: Atraumatic. Nose: No congestion/rhinnorhea. Mouth/Throat: Mucous membranes are moist. Neck: No stridor.  Cardiovascular: Normal rate, regular rhythm. Grossly normal heart sounds.  Good peripheral circulation. Respiratory: Normal respiratory effort.  No retractions. Lungs CTAB. Gastrointestinal: Soft and nontender. No distention. Musculoskeletal: No lower extremity tenderness nor edema. Neurologic:  Normal speech and language. No gross focal neurologic deficits are appreciated.  Skin:  Skin is warm, dry and intact. No rash noted. Psychiatric: Mood and affect are normal. Speech and behavior are normal.  ____________________________________________   LABS (all labs ordered are listed, but only abnormal results are displayed)  Labs Reviewed  BASIC METABOLIC PANEL - Abnormal; Notable for the following components:      Result Value   Potassium 3.2 (*)    CO2 21 (*)    Glucose, Bld 110 (*)    Calcium 8.3 (*)    All other components within normal limits  CBC - Abnormal; Notable for the following components:   Hemoglobin 8.6 (*)    HCT 29.5 (*)    MCV 71.8 (*)    MCH 20.9 (*)    MCHC 29.2 (*)    RDW 17.2 (*)    Platelets 623 (*)    All other components  within normal limits  TROPONIN I (HIGH SENSITIVITY)  TROPONIN I (HIGH SENSITIVITY)  MAGNESIUM  POC URINE PREG, ED   ____________________________________________  EKG  Reviewed entered by me at 1620 Heart rate 95 QRS 95 QTc 480 Normal sinus rhythm, no evidence of acute ischemia.  Slightly prolonged QT ____________________________________________  RADIOLOGY  Dg Chest 2 View  Result Date: 12/16/2018 CLINICAL DATA:  Intermittent chest pain. EXAM: CHEST - 2 VIEW COMPARISON:  10/22/2012 FINDINGS: Cardiomediastinal silhouette is normal. Mediastinal contours appear intact. There is no evidence of focal airspace consolidation, pleural effusion or pneumothorax. Osseous structures are without acute abnormality. Soft tissues are grossly normal. IMPRESSION: No active cardiopulmonary disease. Electronically Signed   By: Ted Mcalpineobrinka  Dimitrova  M.D.   On: 12/16/2018 17:08    Chest x-ray negative for acute ____________________________________________   PROCEDURES  Procedure(s) performed: None  Procedures  Critical Care performed: No  ____________________________________________   INITIAL IMPRESSION / ASSESSMENT AND PLAN / ED COURSE  Pertinent labs & imaging results that were available during my care of the patient were reviewed by me and considered in my medical decision making (see chart for details).   Differential diagnosis includes, but is not limited to, ACS, aortic dissection, pulmonary embolism, cardiac tamponade, pneumothorax, pneumonia, pericarditis, myocarditis, GI-related causes including esophagitis/gastritis, and musculoskeletal chest wall pain.  Additionally, based upon the patient's clinical history she does report anxiety with similar feelings.  Her EKG and troponin are quite reassuring.  She has minimally prolonged QT with slight low potassium which we will replete.  Is a slightly low hemoglobin and reports iron deficiency with low MVC and no bleeding.  I suspect likely iron  deficiency is present and she reports she is had low iron before  First troponin reassuring.  Overall my clinical suspicion for acute ACS is low, no signs or symptoms of PE, dissection, pneumothorax, or intra-abdominal etiology.  No infectious symptoms  Also reports she ran out of her lisinopril, I will refill this for her.  Also discussed with the patient that I will place her on iron supplementation as well as a short course of potassium she is comfortable with this plan.  She is agreeable that she can follow-up and will follow close return precautions.  Second troponin normal.  HEART score low risk HEAR Score: 3       DONNAMAE MUILENBURG was evaluated in Emergency Department on 12/16/2018 for the symptoms described in the history of present illness. She was evaluated in the context of the global COVID-19 pandemic, which necessitated consideration that the patient might be at risk for infection with the SARS-CoV-2 virus that causes COVID-19. Institutional protocols and algorithms that pertain to the evaluation of patients at risk for COVID-19 are in a state of rapid change based on information released by regulatory bodies including the CDC and federal and state organizations. These policies and algorithms were followed during the patient's care in the ED.  No symptoms to suggest need for COVID test at this time.  Friend is driving her home.  Patient reports she does not drive.  Safe use of Ativan discussed with the patient   Prescriber database reviewed.  ____________________________________________   FINAL CLINICAL IMPRESSION(S) / ED DIAGNOSES  Final diagnoses:  Atypical chest pain  Anxiety        Note:  This document was prepared using Dragon voice recognition software and may include unintentional dictation errors       Delman Kitten, MD 12/16/18 2044    Delman Kitten, MD 12/16/18 2046

## 2021-06-23 ENCOUNTER — Emergency Department: Payer: Self-pay

## 2021-06-23 ENCOUNTER — Other Ambulatory Visit: Payer: Self-pay

## 2021-06-23 ENCOUNTER — Emergency Department
Admission: EM | Admit: 2021-06-23 | Discharge: 2021-06-23 | Disposition: A | Discharge status: Home / Self Care | Payer: Self-pay | Attending: Emergency Medicine | Admitting: Emergency Medicine

## 2021-06-23 ENCOUNTER — Encounter: Payer: Self-pay | Admitting: Emergency Medicine

## 2021-06-23 DIAGNOSIS — Y906 Blood alcohol level of 120-199 mg/100 ml: Secondary | ICD-10-CM | POA: Insufficient documentation

## 2021-06-23 DIAGNOSIS — Z79899 Other long term (current) drug therapy: Secondary | ICD-10-CM | POA: Insufficient documentation

## 2021-06-23 DIAGNOSIS — S022XXA Fracture of nasal bones, initial encounter for closed fracture: Secondary | ICD-10-CM | POA: Insufficient documentation

## 2021-06-23 DIAGNOSIS — H05232 Hemorrhage of left orbit: Secondary | ICD-10-CM

## 2021-06-23 DIAGNOSIS — S0083XA Contusion of other part of head, initial encounter: Secondary | ICD-10-CM | POA: Insufficient documentation

## 2021-06-23 DIAGNOSIS — T71193A Asphyxiation due to mechanical threat to breathing due to other causes, assault, initial encounter: Secondary | ICD-10-CM

## 2021-06-23 DIAGNOSIS — S0012XA Contusion of left eyelid and periocular area, initial encounter: Secondary | ICD-10-CM | POA: Insufficient documentation

## 2021-06-23 DIAGNOSIS — I10 Essential (primary) hypertension: Secondary | ICD-10-CM | POA: Insufficient documentation

## 2021-06-23 LAB — CBC WITH DIFFERENTIAL/PLATELET
Abs Immature Granulocytes: 0.03 10*3/uL (ref 0.00–0.07)
Basophils Absolute: 0.1 10*3/uL (ref 0.0–0.1)
Basophils Relative: 1 %
Eosinophils Absolute: 0.2 10*3/uL (ref 0.0–0.5)
Eosinophils Relative: 2 %
HCT: 37.5 % (ref 36.0–46.0)
Hemoglobin: 12 g/dL (ref 12.0–15.0)
Immature Granulocytes: 0 %
Lymphocytes Relative: 27 %
Lymphs Abs: 2.4 10*3/uL (ref 0.7–4.0)
MCH: 25.8 pg — ABNORMAL LOW (ref 26.0–34.0)
MCHC: 32 g/dL (ref 30.0–36.0)
MCV: 80.5 fL (ref 80.0–100.0)
Monocytes Absolute: 0.6 10*3/uL (ref 0.1–1.0)
Monocytes Relative: 7 %
Neutro Abs: 5.5 10*3/uL (ref 1.7–7.7)
Neutrophils Relative %: 63 %
Platelets: 477 10*3/uL — ABNORMAL HIGH (ref 150–400)
RBC: 4.66 MIL/uL (ref 3.87–5.11)
RDW: 17.3 % — ABNORMAL HIGH (ref 11.5–15.5)
WBC: 8.7 10*3/uL (ref 4.0–10.5)
nRBC: 0 % (ref 0.0–0.2)

## 2021-06-23 LAB — COMPREHENSIVE METABOLIC PANEL
ALT: 18 U/L (ref 0–44)
AST: 26 U/L (ref 15–41)
Albumin: 4.2 g/dL (ref 3.5–5.0)
Alkaline Phosphatase: 85 U/L (ref 38–126)
Anion gap: 9 (ref 5–15)
BUN: 11 mg/dL (ref 6–20)
CO2: 22 mmol/L (ref 22–32)
Calcium: 8.8 mg/dL — ABNORMAL LOW (ref 8.9–10.3)
Chloride: 108 mmol/L (ref 98–111)
Creatinine, Ser: 0.54 mg/dL (ref 0.44–1.00)
GFR, Estimated: >60 mL/min (ref >60)
Glucose, Bld: 107 mg/dL — ABNORMAL HIGH (ref 70–99)
Potassium: 3.4 mmol/L — ABNORMAL LOW (ref 3.5–5.1)
Sodium: 139 mmol/L (ref 135–145)
Total Bilirubin: 0.6 mg/dL (ref 0.3–1.2)
Total Protein: 7.1 g/dL (ref 6.5–8.1)

## 2021-06-23 LAB — ETHANOL: Alcohol, Ethyl (B): 149 mg/dL — ABNORMAL HIGH (ref <10)

## 2021-06-23 MED ORDER — CEPHALEXIN 500 MG PO CAPS: 500.0000 mg | ORAL_CAPSULE | Freq: Three times a day (TID) | ORAL | 0 refills | Status: AC

## 2021-06-23 MED ORDER — IOHEXOL 350 MG/ML SOLN
75.0000 mL | Freq: Once | INTRAVENOUS | Status: AC | PRN
  Administered 2021-06-23: 75 mL via INTRAVENOUS

## 2021-06-23 MED ORDER — OXYCODONE-ACETAMINOPHEN 5-325 MG PO TABS
1.0000 | ORAL_TABLET | ORAL | 0 refills | Status: DC | PRN
Start: 2021-06-23 — End: 2023-12-20

## 2021-06-23 MED ORDER — CEPHALEXIN 500 MG PO CAPS
500.0000 mg | ORAL_CAPSULE | Freq: Once | ORAL | Status: AC
  Administered 2021-06-23: 500 mg via ORAL
  Filled 2021-06-23: qty 1

## 2021-06-23 MED ORDER — OXYCODONE-ACETAMINOPHEN 5-325 MG PO TABS
1.0000 | ORAL_TABLET | Freq: Once | ORAL | Status: AC
  Administered 2021-06-23: 1 via ORAL
  Filled 2021-06-23: qty 1

## 2021-06-23 MED ORDER — SODIUM CHLORIDE 0.9 % IV BOLUS
1000.0000 mL | Freq: Once | INTRAVENOUS | Status: AC
  Administered 2021-06-23: 1000 mL via INTRAVENOUS

## 2021-06-23 NOTE — Discharge Instructions (Addendum)
1.  Take antibiotic as prescribed (Keflex 500mg  three times daily x 7 days). 2. You may take Ibuprofen as needed for pain; Percocet as needed for more severe pain. 3. Apply ice to affected area several times daily to reduce swelling. 4. Return to the ER for worsening symptoms, persistent vomiting, lethargy or other concerns.

## 2021-06-23 NOTE — ED Notes (Signed)
MD aware of blood pressure, no further orders.
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
## 2021-06-23 NOTE — ED Notes (Signed)
Patient transferred to  CT scan
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
## 2022-05-17 ENCOUNTER — Emergency Department: Payer: Self-pay

## 2022-05-17 ENCOUNTER — Emergency Department
Admission: EM | Admit: 2022-05-17 | Discharge: 2022-05-17 | Disposition: A | Discharge status: Home / Self Care | Payer: Self-pay | Attending: Emergency Medicine | Admitting: Emergency Medicine

## 2022-05-17 DIAGNOSIS — F17200 Nicotine dependence, unspecified, uncomplicated: Secondary | ICD-10-CM | POA: Insufficient documentation

## 2022-05-17 DIAGNOSIS — I10 Essential (primary) hypertension: Secondary | ICD-10-CM | POA: Insufficient documentation

## 2022-05-17 DIAGNOSIS — Z20822 Contact with and (suspected) exposure to covid-19: Secondary | ICD-10-CM | POA: Insufficient documentation

## 2022-05-17 DIAGNOSIS — J209 Acute bronchitis, unspecified: Secondary | ICD-10-CM | POA: Insufficient documentation

## 2022-05-17 DIAGNOSIS — J069 Acute upper respiratory infection, unspecified: Secondary | ICD-10-CM | POA: Insufficient documentation

## 2022-05-17 LAB — RESP PANEL BY RT-PCR (FLU A&B, COVID) ARPGX2
Influenza A by PCR: POSITIVE — AB
Influenza B by PCR: NEGATIVE
SARS Coronavirus 2 by RT PCR: NEGATIVE

## 2022-05-17 MED ORDER — ALBUTEROL SULFATE HFA 108 (90 BASE) MCG/ACT IN AERS: 2.0000 | INHALATION_SPRAY | Freq: Four times a day (QID) | RESPIRATORY_TRACT | 2 refills | Status: AC | PRN

## 2022-05-17 MED ORDER — PREDNISONE 20 MG PO TABS: 40.0000 mg | ORAL_TABLET | Freq: Every day | ORAL | 0 refills | Status: AC

## 2022-05-17 MED ORDER — AZITHROMYCIN 250 MG PO TABS: ORAL_TABLET | ORAL | 0 refills | Status: AC
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
## 2022-05-17 NOTE — ED Notes (Addendum)
Pt presents to ED with /co of having congestion for 1 week. Pt states flu sick contact.

## 2022-05-17 NOTE — ED Triage Notes (Signed)
Cough and congestion x 1 week 

## 2022-05-17 NOTE — Discharge Instructions (Addendum)
Please take your medications as prescribed.  Please follow-up with your primary care doctor within the next 2 days for recheck/reevaluation.  Return to the emergency department for any trouble breathing, chest pain, or any other symptom personally concerning to yourself.
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
## 2023-12-20 ENCOUNTER — Encounter: Payer: Self-pay | Admitting: Emergency Medicine

## 2023-12-20 ENCOUNTER — Emergency Department
Admission: EM | Admit: 2023-12-20 | Discharge: 2023-12-20 | Disposition: A | Discharge status: Home / Self Care | Payer: Self-pay | Attending: Emergency Medicine | Admitting: Emergency Medicine

## 2023-12-20 ENCOUNTER — Other Ambulatory Visit: Payer: Self-pay

## 2023-12-20 DIAGNOSIS — Z720 Tobacco use: Secondary | ICD-10-CM | POA: Insufficient documentation

## 2023-12-20 DIAGNOSIS — L0211 Cutaneous abscess of neck: Secondary | ICD-10-CM | POA: Insufficient documentation

## 2023-12-20 DIAGNOSIS — L0291 Cutaneous abscess, unspecified: Secondary | ICD-10-CM

## 2023-12-20 DIAGNOSIS — I10 Essential (primary) hypertension: Secondary | ICD-10-CM | POA: Insufficient documentation

## 2023-12-20 DIAGNOSIS — Z760 Encounter for issue of repeat prescription: Secondary | ICD-10-CM | POA: Insufficient documentation

## 2023-12-20 MED ORDER — LISINOPRIL 20 MG PO TABS: 20.0000 mg | ORAL_TABLET | Freq: Every day | ORAL | 11 refills | Status: AC

## 2023-12-20 MED ORDER — SULFAMETHOXAZOLE-TRIMETHOPRIM 800-160 MG PO TABS: 1.0000 | ORAL_TABLET | Freq: Two times a day (BID) | ORAL | 0 refills | Status: AC

## 2023-12-20 MED ORDER — LIDOCAINE-EPINEPHRINE (PF) 2 %-1:200000 IJ SOLN
10.0000 mL | Freq: Once | INTRAMUSCULAR | Status: AC
  Administered 2023-12-20: 10 mL
  Filled 2023-12-20: qty 20

## 2023-12-20 MED ORDER — LIDOCAINE-EPINEPHRINE-TETRACAINE (LET) TOPICAL GEL
3.0000 mL | Freq: Once | TOPICAL | Status: AC
Start: 2023-12-20 — End: 2023-12-20
  Administered 2023-12-20: 3 mL via TOPICAL
  Filled 2023-12-20: qty 3

## 2023-12-20 MED ORDER — DOXYCYCLINE MONOHYDRATE 100 MG PO TABS: 100.0000 mg | ORAL_TABLET | Freq: Two times a day (BID) | ORAL | 0 refills | Status: DC

## 2023-12-20 NOTE — ED Notes (Signed)
 Patient declined discharge vital signs.

## 2023-12-20 NOTE — ED Provider Notes (Signed)
 [facility] Provider Note    Event Date/Time   First MD Initiated Contact with Patient 12/20/23 1002     (approximate)   History   Abscess   HPI  Megan Mejia is a 48 y.o. female  with a past medical history of brief psychotic disorder, cannabis and tobacco use disorder, major depressive disorder, hypertension presents to the emergency department with abscess to the left lateral neck x 2 days.  Patient states it has a foul odor, but she has not seen any discharge or bleeding from the area.  Denies fevers, shortness of breath, chest pain, sore throat.  Patient also is requesting refill of her lisinopril  20 mg once daily for 2 weeks until she can get into see her primary care provider.  Blood pressure is currently 178/92.  No allergies.     Physical Exam   Triage Vital Signs: ED Triage Vitals  Encounter Vitals Group     BP 12/20/23 0850 (!) 178/92     Girls Systolic BP Percentile --      Girls Diastolic BP Percentile --      Boys Systolic BP Percentile --      Boys Diastolic BP Percentile --      Pulse Rate 12/20/23 0850 72     Resp 12/20/23 0850 19     Temp 12/20/23 0850 98 F (36.7 C)     Temp Source 12/20/23 0850 Oral     SpO2 12/20/23 0850 99 %     Weight 12/20/23 0854 165 lb (74.8 kg)     Height 12/20/23 0854 5' (1.524 m)     Head Circumference --      Peak Flow --      Pain Score 12/20/23 0854 8     Pain Loc --      Pain Education --      Exclude from Growth Chart --     Most recent vital signs: Vitals:   12/20/23 0850  BP: (!) 178/92  Pulse: 72  Resp: 19  Temp: 98 F (36.7 C)  SpO2: 99%    General: Awake, in no acute distress. Appears stated age. Head: Normocephalic, atraumatic. Eyes: No scleral icterus or conjunctival injection. Ears/Nose/Throat: Nares patent, no nasal discharge. Oropharynx moist, no erythema or exudate. Dentition intact.  No tenderness to palpation of the parotid glands.  Neck: Supple, no  lymphadenopathy. CV: Regular rate, 72 bpm. Peripheral pulses 2+ and symmetric.  Respiratory: Breath sounds clear b/l. No wheezes, rales, or rhonchi. No respiratory distress. Normal respiratory effort. GI: Soft, non-distended. Skin:Warm, dry. 4x3 cm superficial fluctuance noted on the left lateral neck with some erythema. Neurological: A&Ox4 to person, place, time, and situation.   ED Results / Procedures / Treatments   Labs (all labs ordered are listed, but only abnormal results are displayed) Labs Reviewed - No data to display   EKG     RADIOLOGY     PROCEDURES:  Critical Care performed: No  .Incision and Drainage  Date/Time: 12/20/2023 1:12 PM  Performed by: Sheron Salm, PA-C Authorized by: Sheron Salm, PA-C   Consent:    Consent obtained:  Verbal   Consent given by:  Patient   Risks, benefits, and alternatives were discussed: yes     Risks discussed:  Bleeding, damage to other organs, incomplete drainage, infection and pain Universal protocol:    Procedure explained and questions answered to patient or proxy's satisfaction: yes     Immediately prior to procedure, a time  out was called: yes     Patient identity confirmed:  Verbally with patient Location:    Type:  Abscess   Size:  4x3 cm   Location:  Neck   Neck location:  L anterior Pre-procedure details:    Skin preparation:  Povidone-iodine Sedation:    Sedation type:  None Anesthesia:    Anesthesia method:  Topical application and local infiltration   Topical anesthetic:  LET   Local anesthetic:  Lidocaine  2% w/o epi Procedure type:    Complexity:  Complex Procedure details:    Incision types:  Single straight   Incision depth:  Subcutaneous   Wound management:  Probed and deloculated and irrigated with saline   Drainage:  Purulent and bloody   Drainage amount:  Copious   Wound treatment:  Wound left open   Packing materials:  None Post-procedure details:    Procedure completion:   Tolerated well, no immediate complications     MEDICATIONS ORDERED IN ED: Medications  lidocaine -EPINEPHrine -tetracaine  (LET) topical gel (3 mLs Topical Given 12/20/23 1049)  lidocaine -EPINEPHrine  (XYLOCAINE  W/EPI) 2 %-1:200000 (PF) injection 10 mL (10 mLs Infiltration Given 12/20/23 1156)     IMPRESSION / MDM / ASSESSMENT AND PLAN / ED COURSE  I reviewed the triage vital signs and the nursing notes.                              Differential diagnosis includes, but is not limited to, neck abscess, cyst, neck mass, cellulitis  Patient's presentation is most consistent with acute illness / injury with system symptoms.  Patient is a 48 year old female who presented with a 4 x 3 cm abscess on the lateral aspect of her anterior neck on the left side.  Abscess was probed and drained using incision and drainage method, please see procedure note for full details.  Attempted to pack this area but had difficulty, so decided to leave the wound open to drain.  We did discuss follow-up in 24 to 48 hours either in the ER, urgent care, or with a primary care provider for wound recheck. Wound care instructions discussed. She was given antibiotic prescription to go home with.  Patient also requested lisinopril  20 mg p.o. daily medication refill for her hypertension until she can follow-up with her primary care provider in 2 weeks.  She already has an appointment made.  Patient was given the opportunity to ask questions; all questions were answered. Emergency department return precautions were discussed with the patient.  Patient is in agreement to the treatment plan.  Patient is stable for discharge.   FINAL CLINICAL IMPRESSION(S) / ED DIAGNOSES   Final diagnoses:  Abscess  Encounter for medication refill     Rx / DC Orders   ED Discharge Orders          Ordered    lisinopril  (ZESTRIL ) 20 MG tablet  Daily        12/20/23 1200    doxycycline  (ADOXA) 100 MG tablet  2 times daily,   Status:   Discontinued        12/20/23 1200    sulfamethoxazole -trimethoprim  (BACTRIM  DS) 800-160 MG tablet  2 times daily        12/20/23 1201             Note:  This document was prepared using Dragon voice recognition software and may include unintentional dictation errors.     Sheron, Opp, PA-C 12/20/23 1330  Levander Slate, MD 12/20/23 [phone]

## 2023-12-20 NOTE — Discharge Instructions (Addendum)
You have been seen in the Emergency Department (ED) today for an abscess.  This was drained in the ED. ° °Please follow up with your doctor or in the ED in 24-48 hours for recheck of your wound.  Read through the additional discharge instructions included below regarding wound care recommendations.  Keep the wound clean and dry, though you may wash as you would normally.  Change the dressing twice daily. ° °Call your doctor sooner or return to the ED if you develop worsening signs of infection such as: increased redness, increased pain, pus, or fever. ° ° °Abscess °An abscess is an infected area that contains a collection of pus and debris. It can occur in almost any part of the body. An abscess is also known as a furuncle or boil. °CAUSES  °An abscess occurs when tissue gets infected. This can occur from blockage of oil or sweat glands, infection of Cretella follicles, or a minor injury to the skin. As the body tries to fight the infection, pus collects in the area and creates pressure under the skin. This pressure causes pain. People with weakened immune systems have difficulty fighting infections and get certain abscesses more often.  °SYMPTOMS °Usually an abscess develops on the skin and becomes a painful mass that is red, warm, and tender. If the abscess forms under the skin, you may feel a moveable soft area under the skin. Some abscesses break open (rupture) on their own, but most will continue to get worse without care. The infection can spread deeper into the body and eventually into the bloodstream, causing you to feel ill.  °DIAGNOSIS  °Your caregiver will take your medical history and perform a physical exam. A sample of fluid may also be taken from the abscess to determine what is causing your infection. °TREATMENT  °Your caregiver may prescribe antibiotic medicines to fight the infection. However, taking antibiotics alone usually does not cure an abscess. Your caregiver may need to make a small cut  (incision) in the abscess to drain the pus. In some cases, gauze is packed into the abscess to reduce pain and to continue draining the area. °HOME CARE INSTRUCTIONS  °Only take over-the-counter or prescription medicines for pain, discomfort, or fever as directed by your caregiver. °If you were prescribed antibiotics, take them as directed. Finish them even if you start to feel better. °If gauze is used, follow your caregiver's directions for changing the gauze. °To avoid spreading the infection: °Keep your draining abscess covered with a bandage. °Wash your hands well. °Do not share personal care items, towels, or whirlpools with others. °Avoid skin contact with others. °Keep your skin and clothes clean around the abscess. °Keep all follow-up appointments as directed by your caregiver. °SEEK MEDICAL CARE IF:  °You have increased pain, swelling, redness, fluid drainage, or bleeding. °You have muscle aches, chills, or a general ill feeling. °You have a fever. °MAKE SURE YOU:  °Understand these instructions. °Will watch your condition. °Will get help right away if you are not doing well or get worse. °Document Released: 03/08/2005 Document Revised: 11/28/2011 Document Reviewed: 08/11/2011 °[employer]® Patient Information ©2015 [employer]. This information is not intended to replace advice given to you by your health care provider. Make sure you discuss any questions you have with your health care provider. ° °Abscess °Care After °An abscess (also called a boil or furuncle) is an infected area that contains a collection of pus. Signs and symptoms of an abscess include pain, tenderness, redness, or hardness,   or you may feel a moveable soft area under your skin. An abscess can occur anywhere in the body. The infection may spread to surrounding tissues causing cellulitis. A cut (incision) by the surgeon was made over your abscess and the pus was drained out. Gauze may have been packed into the space to provide a drain  that will allow the cavity to heal from the inside outwards. The boil may be painful for 5 to 7 days. Most people with a boil do not have high fevers. Your abscess, if seen early, may not have localized, and may not have been lanced. If not, another appointment may be required for this if it does not get better on its own or with medications. °HOME CARE INSTRUCTIONS  °Only take over-the-counter or prescription medicines for pain, discomfort, or fever as directed by your caregiver. °When you bathe, soak and then remove gauze or iodoform packs at least daily or as directed by your caregiver. You may then wash the wound gently with mild soapy water. Repack with gauze or do as your caregiver directs. °SEEK IMMEDIATE MEDICAL CARE IF:  °You develop increased pain, swelling, redness, drainage, or bleeding in the wound site. °You develop signs of generalized infection including muscle aches, chills, fever, or a general ill feeling. °An oral temperature above 102° F (38.9° C) develops, not controlled by medication. °See your caregiver for a recheck if you develop any of the symptoms described above. If medications (antibiotics) were prescribed, take them as directed. °Document Released: 12/15/2004 Document Revised: 08/21/2011 Document Reviewed: 08/12/2007 °[employer]® Patient Information ©2015 [employer]. This information is not intended to replace advice given to you by your health care provider. Make sure you discuss any questions you have with your health care provider. ° °Cellulitis °Cellulitis is an infection of the skin and the tissue beneath it. The infected area is usually red and tender. Cellulitis occurs most often in the arms and lower legs.  °CAUSES  °Cellulitis is caused by bacteria that enter the skin through cracks or cuts in the skin. The most common types of bacteria that cause cellulitis are staphylococci and streptococci. °SIGNS AND SYMPTOMS  °Redness and warmth. °Swelling. °Tenderness or  pain. °Fever. °DIAGNOSIS  °Your health care provider can usually determine what is wrong based on a physical exam. Blood tests may also be done. °TREATMENT  °Treatment usually involves taking an antibiotic medicine. °HOME CARE INSTRUCTIONS  °Take your antibiotic medicine as directed by your health care provider. Finish the antibiotic even if you start to feel better. °Keep the infected arm or leg elevated to reduce swelling. °Apply a warm cloth to the affected area up to 4 times per day to relieve pain. °Take medicines only as directed by your health care provider. °Keep all follow-up visits as directed by your health care provider. °SEEK MEDICAL CARE IF:  °You notice red streaks coming from the infected area. °Your red area gets larger or turns dark in color. °Your bone or joint underneath the infected area becomes painful after the skin has healed. °Your infection returns in the same area or another area. °You notice a swollen bump in the infected area. °You develop new symptoms. °You have a fever. °SEEK IMMEDIATE MEDICAL CARE IF:  °You feel very sleepy. °You develop vomiting or diarrhea. °You have a general ill feeling (malaise) with muscle aches and pains. °MAKE SURE YOU:  °Understand these instructions. °Will watch your condition. °Will get help right away if you are not doing well or get   worse. °Document Released: 03/08/2005 Document Revised: 10/13/2013 Document Reviewed: 08/14/2011 °[employer]® Patient Information ©2015 [employer]. This information is not intended to replace advice given to you by your health care provider. Make sure you discuss any questions you have with your health care provider. ° ° ° °

## 2023-12-20 NOTE — ED Triage Notes (Signed)
 Pt via POV from home. Pt has abscess on the L side of her neck that started 2 days ago. Reports some malodorous discharge. Reports that she is also out of her lisinopril  for 2 weeks and asking if we could refill that for her. Pt is A&Ox4 and NAD, ambulatory to triage.
